# Patient Record
Sex: Female | Born: 1960 | Race: White | Hispanic: No | Marital: Married | Smoking: Never smoker
Health system: Southern US, Community
[De-identification: ages and names within clinical notes are randomized; demographics above are authoritative.]

## PROBLEM LIST (undated history)

## (undated) DIAGNOSIS — F419 Anxiety disorder, unspecified: Secondary | ICD-10-CM

## (undated) DIAGNOSIS — F32A Depression, unspecified: Secondary | ICD-10-CM

## (undated) DIAGNOSIS — M797 Fibromyalgia: Secondary | ICD-10-CM

## (undated) DIAGNOSIS — F329 Major depressive disorder, single episode, unspecified: Secondary | ICD-10-CM

## (undated) DIAGNOSIS — R413 Other amnesia: Secondary | ICD-10-CM

## (undated) HISTORY — PX: BLADDER SURGERY: SHX569

## (undated) HISTORY — DX: Major depressive disorder, single episode, unspecified: F32.9

## (undated) HISTORY — PX: RECTOCELE REPAIR: SHX761

## (undated) HISTORY — DX: Depression, unspecified: F32.A

## (undated) HISTORY — DX: Anxiety disorder, unspecified: F41.9

## (undated) HISTORY — PX: CYSTOCELE REPAIR: SHX163

## (undated) HISTORY — DX: Fibromyalgia: M79.7

## (undated) HISTORY — PX: ABDOMINAL HYSTERECTOMY: SHX81

## (undated) HISTORY — PX: CHOLECYSTECTOMY: SHX55

## (undated) HISTORY — DX: Other amnesia: R41.3

---

## 2010-05-16 HISTORY — PX: VESICOVAGINAL FISTULA CLOSURE W/ TAH: SUR271

## 2014-09-25 DIAGNOSIS — N8111 Cystocele, midline: Secondary | ICD-10-CM | POA: Insufficient documentation

## 2014-11-10 DIAGNOSIS — N816 Rectocele: Secondary | ICD-10-CM | POA: Insufficient documentation

## 2015-02-11 ENCOUNTER — Ambulatory Visit (INDEPENDENT_AMBULATORY_CARE_PROVIDER_SITE_OTHER): Payer: 59 | Admitting: Neurology

## 2015-02-11 ENCOUNTER — Encounter: Payer: Self-pay | Admitting: Neurology

## 2015-02-11 VITALS — BP 144/90 | HR 72 | Resp 16 | Ht 65.5 in | Wt 162.8 lb

## 2015-02-11 DIAGNOSIS — M5442 Lumbago with sciatica, left side: Secondary | ICD-10-CM | POA: Diagnosis not present

## 2015-02-11 DIAGNOSIS — G4700 Insomnia, unspecified: Secondary | ICD-10-CM | POA: Diagnosis not present

## 2015-02-11 DIAGNOSIS — M545 Low back pain, unspecified: Secondary | ICD-10-CM | POA: Insufficient documentation

## 2015-02-11 DIAGNOSIS — F418 Other specified anxiety disorders: Secondary | ICD-10-CM | POA: Diagnosis not present

## 2015-02-11 DIAGNOSIS — M797 Fibromyalgia: Secondary | ICD-10-CM | POA: Diagnosis not present

## 2015-02-11 DIAGNOSIS — M5431 Sciatica, right side: Secondary | ICD-10-CM

## 2015-02-11 DIAGNOSIS — M5432 Sciatica, left side: Secondary | ICD-10-CM | POA: Diagnosis not present

## 2015-02-11 HISTORY — DX: Sciatica, right side: M54.31

## 2015-02-11 MED ORDER — GABAPENTIN 800 MG PO TABS: 800.0000 mg | ORAL_TABLET | Freq: Every day | ORAL | Status: DC

## 2015-02-11 MED ORDER — ETODOLAC 400 MG PO TABS: 400.0000 mg | ORAL_TABLET | Freq: Two times a day (BID) | ORAL | Status: DC

## 2015-02-11 NOTE — Progress Notes (Signed)
[facility]  PATIENT: Kimberly Swanson DOB: 1960-07-22  REFERRING DOCTOR OR PCP:  Gala Romney Schultz/Amy Moon ([place]) SOURCE: patient and records from [facility],  _________________________________   HISTORICAL  CHIEF COMPLAINT:  Chief Complaint  Patient presents with  . Memory Loss    Former pt. of Dr. Bonnita Hollow from [facility], here for eval of memory loss onset 3-4 yrs. ago, and lower back pain radiating into bilat hips.  An example of her memory loss is that she might forget she has cookies in the oven, and they burn.  Sts. has received some relief of back pain with oral steroids.  Also sts. needs r/f of Gabapentin  qhs./fim  . Back Pain    HISTORY OF PRESENT ILLNESS: Kimberly Swanson is a 54 year old woman who I have previously seen at [facility]. She has fibromyalgia, memory disturbances, insomnia, depression and low back pain.  FMS:   She reports pain in the muscles and joints throughout her body. Pain is worse with pressure on the muscles.  Pain is worse if she is tired.   This has been a chronic problem for her. She was taking gabapentin 800 mg by mouth daily at bedtime and that has helped her pain, though incompletely.   She has not been on Cymbalta.  Insomnia:   She has had difficulties with sleep maintenance insomnia much more so than sleep onset insomnia. This has also been a chronic problem for her. When she started gabapentin at bedtime she had a benefit. She does much better with 800 mg by mouth 2 hours before bedtime than a lower dose. A PSG showed some difficulty staying asleep but was otherwise normal.   No OSA.     LBP/Leg pain:   She reports LBP and left buttock pain radiating to the leg.   Pain increases with standing and decreases laying down with hips slightly stretched.   She was placed on a Prednisone taper and started PT.   The steroid helped.    PT has not helped yet.    Flexeril or daytime gabapentin make her too  sleepy.      Mood/cognitive:   She she has noticed some depression and that has improved with Wellbutrin XL 150 mg daily.  She notes some anxiety, less troublesome than the depression.     She notes reduced cognition, mostly poor focus and some reduced short term memory.  She can multitask most of the time but has some distractability and does best when she makes lists.    Montreal Cognitive Assessment  02/11/2015  Visuospatial/ Executive (0/5) 5  Naming (0/3) 3  Attention: Read list of digits (0/2) 2  Attention: Read list of letters (0/1) 1  Attention: Serial 7 subtraction starting at 100 (0/3) 3  Language: Repeat phrase (0/2) 2  Language : Fluency (0/1) 1  Abstraction (0/2) 2  Delayed Recall (0/5) 3  Orientation (0/6) 6  Total 28  Adjusted Score (based on education) 29       REVIEW OF SYSTEMS: Constitutional: No fevers, chills, sweats, or change in appetite Eyes: No visual changes, double vision, eye pain Ear, nose and throat: No hearing loss, ear pain, nasal congestion, sore throat Cardiovascular: No chest pain, palpitations Respiratory: No shortness of breath at rest or with exertion.   No wheezes GastrointestinaI: No nausea, vomiting, diarrhea, abdominal pain, fecal incontinence Genitourinary: No dysuria, urinary retention or frequency.  No nocturia. Musculoskeletal: No neck pain, back pain Integumentary: No rash, pruritus, skin lesions Neurological: as  above Psychiatric: No depression at this time.  No anxiety Endocrine: No palpitations, diaphoresis, change in appetite, change in weigh or increased thirst Hematologic/Lymphatic: No anemia, purpura, petechiae. Allergic/Immunologic: No itchy/runny eyes, nasal congestion, recent allergic reactions, rashes  ALLERGIES: Allergies  Allergen Reactions  . Codeine Nausea And Vomiting    HOME MEDICATIONS:  Current outpatient prescriptions:  .  ALPRAZolam (XANAX) 0.25 MG tablet, 0.25 mg., Disp: , Rfl:  .  buPROPion  (WELLBUTRIN XL) 150 MG 24 hr tablet, 150 mg., Disp: , Rfl:  .  etodolac (LODINE) 400 MG tablet, Take 1 tablet (400 mg total) by mouth 2 (two) times daily., Disp: 180 tablet, Rfl: 1 .  gabapentin (NEURONTIN) 800 MG tablet, Take 1 tablet (800 mg total) by mouth at bedtime., Disp: 90 tablet, Rfl: 4  PAST MEDICAL HISTORY: Past Medical History  Diagnosis Date  . Memory loss   . Depression   . Anxiety   . Fibromyalgia     PAST SURGICAL HISTORY: Past Surgical History  Procedure Laterality Date  . Vesicovaginal fistula closure w/ tah  2012  . Cholecystectomy      FAMILY HISTORY: No family history on file.  SOCIAL HISTORY:  Social History   Social History  . Marital Status: Married    Spouse Name: N/A  . Number of Children: N/A  . Years of Education: N/A   Occupational History  . Not on file.   Social History Main Topics  . Smoking status: Not on file  . Smokeless tobacco: Not on file  . Alcohol Use: Not on file  . Drug Use: Not on file  . Sexual Activity: Not on file   Other Topics Concern  . Not on file   Social History Narrative  . No narrative on file     PHYSICAL EXAM  Filed Vitals:   02/11/15 1035  BP: 144/90  Pulse: 72  Resp: 16  Height: 5' 5.5" (1.664 m)  Weight: 162 lb 12.8 oz (73.846 kg)    Body mass index is 26.67 kg/(m^2).   General: The patient is well-developed and well-nourished and in no acute distress  Eyes:  Funduscopic exam shows normal optic discs and retinal vessels.  Neck: The neck is supple, no carotid bruits are noted.  The neck is nontender.  Cardiovascular: The heart has a regular rate and rhythm with a normal S1 and S2. There were no murmurs, gallops or rubs.   Skin: Extremities are without significant edema.  Musculoskeletal:  Back is tender over the left much more so than right piriformis muscle. Motion is good at the hip. The trochanteric bursae are not tender. The paraspinals are not too tender.  Neurologic  Exam  Mental status: The patient is alert and oriented x 3 at the time of the examination. The patient has apparent normal recent and remote memory, with an apparently normal attention span and concentration ability.   Speech is normal.  Cranial nerves: Extraocular movements are full. Pupils are equal, round, and reactive to light and accomodation.    There is good facial sensation to soft touch bilaterally.Facial strength is normal.  Trapezius and sternocleidomastoid strength is normal. No dysarthria is noted.  The tongue is midline, and the patient has symmetric elevation of the soft palate. No obvious hearing deficits are noted.  Motor:  Muscle bulk is normal.   Tone is normal. Strength is  5 / 5 in all 4 extremities.   Sensory: Sensory testing is intact to pinprick, soft touch and vibration sensation  in all 4 extremities.  Coordination: Cerebellar testing reveals good finger-nose-finger and heel-to-shin bilaterally.  Gait and station: Station is normal.   Gait is normal. Tandem gait is normal. Romberg is negative.   Reflexes: Deep tendon reflexes are symmetric and normal bilaterally.       DIAGNOSTIC DATA (LABS, IMAGING, TESTING) - I reviewed patient records, labs, notes, testing and imaging myself where available.      ASSESSMENT AND PLAN  Sciatica of left side  Fibromyalgia  Insomnia  Left-sided low back pain with left-sided sciatica  Depression with anxiety   1.   Renew gabapentin. 2.   Start an NSAID. She is pain in having surgery soon and should stop the anti-inflammatory a couple days before her surgery. 3.    She will call us back in about a month if not better and we will do an MRI of the lumbar spine and consider also doing a piriformis muscle trigger point injection. 4.    She will return to see me in 2 months or sooner if there are new or worsening neurologic symptoms. 5.   We discussed that her memory issues are most likely due to decreased focus, probably  from depression. She should continue on the Wellbutrin.  45 minutes face to face with >50% of the time counseling and coordinating care about her neurologic issues and prognosis  Richard A. Epimenio Foot, MD, PhD 02/11/2015, 6:29 PM Certified in Neurology, Clinical Neurophysiology, Sleep Medicine, Pain Medicine and Neuroimaging  [facility] [address] [place], [place] [address] [phone]

## 2015-02-25 ENCOUNTER — Telehealth: Payer: Self-pay | Admitting: Neurology

## 2015-02-25 DIAGNOSIS — M5442 Lumbago with sciatica, left side: Secondary | ICD-10-CM

## 2015-02-25 NOTE — Telephone Encounter (Signed)
I have spoken with Kimberly Swanson this afternoon.  She sts. no relief of back pain with Etodolac, physical therapy.  She reqests mri.  Per RAS last ov note, if pt continues to be unsuccessful, he would order mri L-spine.  As RAS is out of the office this week, I have spoken with Dr. Vickey Hugerohmeier.  She has looked at chart and sts. since pt. with no hx. of cancer in which mets might be an issue, and no hx. of back surgery, mri L-spine without contrast should be adequate.  MRI ordered, with [facility] if insurance prefers./fim

## 2015-02-25 NOTE — Telephone Encounter (Signed)
Patient called, Dr. Felecia Shelling had prescribed etodolac (LODINE) 400 MG tablet in the past and "it didn't do anything". Patient was under the impression Dr. Felecia Shelling was going to give her something stronger however when she received Rx in the mail from [facility] it was the same medication, etodolac (Lodine). Patient also wanted to advise that she is still doing PT "hasn't done a thing for me" but is continuing to do it since it's not costing her anything (she has met [facility] and out of pocket). Dr. Felecia Shelling had previously mentioned doing an MRI. She would like to have this done at either [facility] or [facility]. Please call patient at [phone] or [phone].

## 2015-03-02 NOTE — Telephone Encounter (Signed)
Update given to RAS/fim

## 2015-03-05 NOTE — Telephone Encounter (Signed)
Updated the MRI auth [facility]. Called/LMOM for Wedny regarding this change.

## 2015-03-05 NOTE — Telephone Encounter (Signed)
[facility] [phone], order was received for MRI but is [facility]. Toniann FailWendy is requesting change in order to [facility]. Tax ID# [id number] NPI# [id number].

## 2015-03-13 ENCOUNTER — Telehealth: Payer: Self-pay | Admitting: Neurology

## 2015-03-13 NOTE — Telephone Encounter (Signed)
Pt called requesting MRI results. Please call and advise at [phone] and [phone]

## 2015-03-16 NOTE — Telephone Encounter (Signed)
She had wanted [facility]   ---  we don't have the results yet sent to us. Please find out where it was done and we can directly request the report and the films on CD.

## 2015-03-17 NOTE — Telephone Encounter (Signed)
I have spoken with Efraim KaufmannMelissa in MRI at [facility], (phone # [phone]) and have requeted cd of mri spine recently done there/fim

## 2015-03-17 NOTE — Telephone Encounter (Signed)
I have spoken with MRI at [facility] and per their request, faxed a request for Kimberly Swanson's mri spine that was recently done there, even tho Dr. Epimenio FootSater was the ordering physician--to them at [phone]/fim

## 2015-03-23 NOTE — Telephone Encounter (Signed)
CD of mri received today, on RAS desk for review/fim

## 2015-03-24 ENCOUNTER — Telehealth: Payer: Self-pay | Admitting: *Deleted

## 2015-03-24 NOTE — Telephone Encounter (Signed)
-----   Message from Richard A Sater, MD sent at 03/23/2015  5:20 PM EST ----- Please let her know that the MRI of the lumbar spine shows Degenerative changes worse at L4L5 but no nerve root compression.    

## 2015-03-24 NOTE — Telephone Encounter (Signed)
LMTC./fim 

## 2015-03-24 NOTE — Telephone Encounter (Signed)
-----   Message from Asa Lenteichard A Sater, MD sent at 03/23/2015  5:20 PM EST ----- Please let her know that the MRI of the lumbar spine shows Degenerative changes worse at L4L5 but no nerve root compression.

## 2015-03-27 ENCOUNTER — Encounter: Payer: Self-pay | Admitting: Neurology

## 2015-04-14 ENCOUNTER — Ambulatory Visit: Payer: 59 | Admitting: Neurology

## 2015-04-20 ENCOUNTER — Telehealth: Payer: Self-pay | Admitting: *Deleted

## 2015-04-20 NOTE — Telephone Encounter (Signed)
I have spoken with Gayatri and advised that I spoke with her on 03-24-15, and per RAS, advised that mri L-spine showed degen. changes worse at L4-5, bu no nerve root compression.  She verbalized understanding of same/fim

## 2015-04-20 NOTE — Telephone Encounter (Signed)
Patient called, states she never received results from MRI, would like to know that we have received results before she comes in for appointment 04/22/15. Please call [phone].

## 2015-04-20 NOTE — Telephone Encounter (Signed)
Duplicate encounter/fim 

## 2015-04-22 ENCOUNTER — Ambulatory Visit (INDEPENDENT_AMBULATORY_CARE_PROVIDER_SITE_OTHER): Payer: 59 | Admitting: Neurology

## 2015-04-22 ENCOUNTER — Encounter: Payer: Self-pay | Admitting: Neurology

## 2015-04-22 VITALS — BP 138/79 | HR 81 | Ht 65.5 in | Wt 167.6 lb

## 2015-04-22 DIAGNOSIS — F418 Other specified anxiety disorders: Secondary | ICD-10-CM

## 2015-04-22 DIAGNOSIS — G4700 Insomnia, unspecified: Secondary | ICD-10-CM | POA: Diagnosis not present

## 2015-04-22 DIAGNOSIS — M797 Fibromyalgia: Secondary | ICD-10-CM

## 2015-04-22 DIAGNOSIS — M5442 Lumbago with sciatica, left side: Secondary | ICD-10-CM | POA: Diagnosis not present

## 2015-04-22 MED ORDER — CYCLOBENZAPRINE HCL 5 MG PO TABS: 5.0000 mg | ORAL_TABLET | Freq: Every day | ORAL | Status: DC

## 2015-04-22 MED ORDER — ETODOLAC 400 MG PO TABS: 400.0000 mg | ORAL_TABLET | Freq: Two times a day (BID) | ORAL | Status: DC

## 2015-04-22 NOTE — Progress Notes (Signed)
[facility]  PATIENT: Kimberly Swanson DOB: 04/22/1961  REFERRING DOCTOR OR PCP:  Gala Romneyoug Schultz/Amy Moon ([place]) SOURCE: patient and records from [facility],  _________________________________   HISTORICAL  CHIEF COMPLAINT:  Chief Complaint  Patient presents with  . Follow-up    rm 3. Doing okay. Had to go off etodolac for 7 days for surgery. It helps. Was doing PT but stopped d/t surgery. They told her 9-12 weeks for recovery. She is on week 4. She does not think gabapentin is helping very much anymore. She wakes up around 330/4am.     HISTORY OF PRESENT ILLNESS: Kimberly Lady SaucierHosler is a 54 year old woman with fibromyalgia, memory disturbances, insomnia, depression and low back pain.    She had pelvic surgery a few weeks ago and is still sore.     She needed to be off her NSAID a week and feels better with it.     FMS:   She reports pain in the muscles and joints throughout her body. Pain is worse with pressure on the muscles.  Pain is worse if she is tired.   This has been a chronic problem for her. She was taking gabapentin 800 mg by mouth daily at bedtime and that has helped her pain, though incompletely.   She has not been on Cymbalta.  Insomnia:   She has had difficulties with sleep maintenance insomnia much more so than sleep onset insomnia. This has also been a chronic problem for her. When she started gabapentin at bedtime she had a benefit. She does much better with 800 mg by mouth 2 hours before bedtime than a lower dose. A PSG showed some difficulty staying asleep but was otherwise normal.   No OSA.     LBP/Leg pain:   She reports LBP only if she stands a while but will have left buttock pain radiating to the leg more frequently.   Pain increases with standing and decreases laying down with hips slightly stretched.   She was placed on a Prednisone taper and started PT.   The steroid helped.    PT has not helped yet.    Flexeril or daytime gabapentin make  her too sleepy.      MRI lumbar spine 03/05/15 (personally reviewed) showed degenerative disc changes and spondylosis at L3L4 and L4L5 but no nerve root impingement.     Mood/cognitive:   She she has noticed some depression and that has improved with Wellbutrin XL 150 mg daily.  She notes some anxiety, less troublesome than the depression.     She notes reduced cognition, mostly poor focus and some reduced short term memory.  She can multitask most of the time but has some distractability and does best when she makes lists.    Montreal Cognitive Assessment  02/11/2015  Visuospatial/ Executive (0/5) 5  Naming (0/3) 3  Attention: Read list of digits (0/2) 2  Attention: Read list of letters (0/1) 1  Attention: Serial 7 subtraction starting at 100 (0/3) 3  Language: Repeat phrase (0/2) 2  Language : Fluency (0/1) 1  Abstraction (0/2) 2  Delayed Recall (0/5) 3  Orientation (0/6) 6  Total 28  Adjusted Score (based on education) 29      REVIEW OF SYSTEMS: Constitutional: No fevers, chills, sweats, or change in appetite.   Notes fatigue Eyes: No visual changes, double vision, eye pain Ear, nose and throat: No hearing loss, ear pain, nasal congestion, sore throat Cardiovascular: No chest pain, palpitations Respiratory: No shortness of  breath at rest or with exertion.   No wheezes GastrointestinaI: No nausea, vomiting, diarrhea, abdominal pain, fecal incontinence Genitourinary: No dysuria, urinary retention or frequency.  No nocturia. Musculoskeletal: as above  Integumentary: No rash, pruritus, skin lesions Neurological: as above Psychiatric: Notes depression and anxiety Endocrine: No palpitations, diaphoresis, change in appetite, change in weigh or increased thirst Hematologic/Lymphatic: No anemia, purpura, petechiae. Allergic/Immunologic: No itchy/runny eyes, nasal congestion, recent allergic reactions, rashes  ALLERGIES: Allergies  Allergen Reactions  . Codeine Nausea And  Vomiting    HOME MEDICATIONS:  Current outpatient prescriptions:  .  ALPRAZolam (XANAX) 0.25 MG tablet, 0.25 mg., Disp: , Rfl:  .  buPROPion (WELLBUTRIN XL) 150 MG 24 hr tablet, 150 mg., Disp: , Rfl:  .  CLIMARA 0.1 MG/24HR patch, Apply 1 patch topically once a week., Disp: , Rfl:  .  docusate sodium (COLACE) 100 MG capsule, Take 100 mg by mouth 2 (two) times daily., Disp: , Rfl:  .  etodolac (LODINE) 400 MG tablet, Take 1 tablet (400 mg total) by mouth 2 (two) times daily., Disp: 180 tablet, Rfl: 1 .  gabapentin (NEURONTIN) 800 MG tablet, Take 1 tablet (800 mg total) by mouth at bedtime., Disp: 90 tablet, Rfl: 4 .  omeprazole (PRILOSEC) 40 MG capsule, Take 40 mg by mouth daily., Disp: , Rfl:  .  polyethylene glycol (MIRALAX / GLYCOLAX) packet, Take 1 packet by mouth daily., Disp: , Rfl:  .  ranitidine (ZANTAC) 150 MG capsule, Take 150 mg by mouth daily., Disp: , Rfl:   PAST MEDICAL HISTORY: Past Medical History  Diagnosis Date  . Memory loss   . Depression   . Anxiety   . Fibromyalgia     PAST SURGICAL HISTORY: Past Surgical History  Procedure Laterality Date  . Vesicovaginal fistula closure w/ tah  2012  . Cholecystectomy      FAMILY HISTORY: History reviewed. No pertinent family history.  SOCIAL HISTORY:  Social History   Social History  . Marital Status: Married    Spouse Name: N/A  . Number of Children: N/A  . Years of Education: N/A   Occupational History  . Not on file.   Social History Main Topics  . Smoking status: Never Smoker   . Smokeless tobacco: Not on file  . Alcohol Use: Not on file  . Drug Use: Not on file  . Sexual Activity: Not on file   Other Topics Concern  . Not on file   Social History Narrative     PHYSICAL EXAM  Filed Vitals:   04/22/15 1101  BP: 138/79  Pulse: 81  Height: 5' 5.5" (1.664 m)  Weight: 167 lb 9.6 oz (76.023 kg)    Body mass index is 27.46 kg/(m^2).   General: The patient is well-developed and  well-nourished and in no acute distress   Musculoskeletal:  Back is tender over the left > right piriformis muscle. ROM is good at the hip.  Osie Cheeks.. The trochanteric bursae are not tender. The paraspinals are not tender.    Also tender over many of the classic fibromyalgia tender points of the upper chest , upper back and neck.  Neurologic Exam  Mental status: The patient is alert and oriented x 3 at the time of the examination. The patient has apparent normal recent and remote memory, with an apparently normal attention span and concentration ability.   Speech is normal.  Cranial nerves: Extraocular movements are full. Pupils are equal, round, and reactive to light and accomodation.  There is good facial sensation to soft touch bilaterally.Facial strength is normal.  Trapezius and sternocleidomastoid strength is normal. No dysarthria is noted.  The tongue is midline, and the patient has symmetric elevation of the soft palate. No obvious hearing deficits are noted.  Motor:  Muscle bulk is normal.   Tone is normal. Strength is  5 / 5 in all 4 extremities.   Sensory: Sensory testing is intact to pinprick, soft touch and vibration sensation in all 4 extremities.  Coordination: Cerebellar testing reveals good finger-nose-finger bilaterally.  Gait and station: Station is normal.   Gait is normal. Tandem gait is normal.  e.   Reflexes: Deep tendon reflexes are symmetric and normal bilaterally.       DIAGNOSTIC DATA (LABS, IMAGING, TESTING) - I reviewed patient records, labs, notes, testing and imaging myself where available.      ASSESSMENT AND PLAN  Fibromyalgia  Left-sided low back pain with left-sided sciatica  Insomnia  Depression with anxiety   1.   Continue gabapentin but move to 10 pm.   Can also try flexeril 5 mg at bedtime 2.   Continue NSAID.  3.     Set up PT if not better in January.     If pain worsens, we can do a piriformis muscle injection 4.     rtc 6  months or sooner if new or worsening neurologic issues.  Rosette Bellavance A. Epimenio Foot, MD, PhD 04/22/2015, 11:18 AM Certified in Neurology, Clinical Neurophysiology, Sleep Medicine, Pain Medicine and Neuroimaging  [facility] [address] [place], [place] [address] [phone]

## 2015-04-23 ENCOUNTER — Ambulatory Visit: Payer: 59 | Admitting: Neurology

## 2016-04-13 ENCOUNTER — Telehealth: Payer: Self-pay | Admitting: Neurology

## 2016-04-14 NOTE — Telephone Encounter (Signed)
error 

## 2016-05-05 ENCOUNTER — Encounter: Payer: Self-pay | Admitting: Neurology

## 2016-05-05 ENCOUNTER — Ambulatory Visit (INDEPENDENT_AMBULATORY_CARE_PROVIDER_SITE_OTHER): Payer: 59 | Admitting: Neurology

## 2016-05-05 VITALS — BP 114/80 | HR 78 | Resp 16 | Ht 63.5 in | Wt 152.5 lb

## 2016-05-05 DIAGNOSIS — M5442 Lumbago with sciatica, left side: Secondary | ICD-10-CM | POA: Diagnosis not present

## 2016-05-05 DIAGNOSIS — F418 Other specified anxiety disorders: Secondary | ICD-10-CM

## 2016-05-05 DIAGNOSIS — G4700 Insomnia, unspecified: Secondary | ICD-10-CM | POA: Diagnosis not present

## 2016-05-05 DIAGNOSIS — M797 Fibromyalgia: Secondary | ICD-10-CM | POA: Diagnosis not present

## 2016-05-05 MED ORDER — CYCLOBENZAPRINE HCL 5 MG PO TABS: 5.0000 mg | ORAL_TABLET | Freq: Every day | ORAL | 3 refills | Status: DC

## 2016-05-05 MED ORDER — GABAPENTIN 800 MG PO TABS: 800.0000 mg | ORAL_TABLET | Freq: Every day | ORAL | 4 refills | Status: DC

## 2016-05-05 MED ORDER — ETODOLAC 400 MG PO TABS: 400.0000 mg | ORAL_TABLET | Freq: Two times a day (BID) | ORAL | 3 refills | Status: DC

## 2016-05-05 NOTE — Progress Notes (Signed)
[facility]  PATIENT: Kimberly Swanson DOB: 09/05/1960  REFERRING DOCTOR OR PCP:  Gala Romney Schultz/Amy Moon () SOURCE: patient and records from [facility],  _________________________________   HISTORICAL  CHIEF COMPLAINT:  Chief Complaint  Patient presents with  . FMS    Sts. she continues Etodolac prn generalized pain.  Sts. sleep is improved since moving Gabapentin to 10pm/fim  . Insomnia    HISTORY OF PRESENT ILLNESS: Kimberly Swanson is a 55 year old woman with fibromyalgia, memory disturbances, insomnia, depression and low back pain.      FMS:   She has pain in the muscles and joints throughout her body.    This has been a chronic problem for her. She was taking gabapentin 800 mg by mouth daily at bedtime and that has helped her pain, though incompletely.  During the day she takes etodolac. Tylenol with Advil also helps.  Insomnia:   She had a bad night last night but is generally doing better.   She has had difficulties with sleep maintenance insomnia much more so than sleep onset insomnia. This has also been a chronic problem for her. Gabapentin 800 mg by mouth 2 hours before bedtime helps her sleep. A PSG showed some difficulty staying asleep but was otherwise normal.   No OSA.     LBP/Leg pain:   Her LBP has been more intermittent.  Pain sometimes radiates to the left buttock.   If this occurs, she sometimes gets some painful numbness down the left leg.   (occasional right) .   She reports LBP only if she stands a while but will have left buttock pain radiating to the leg more frequently.   Pain increases with standing or sitting a while increases ht pain and walking gently helps to decrease it.     PT had not helped.   She tries to walk as exercise.    Gabapentin at night helps some and she rarely takes a Flexeril (causes lethargy).    MRI lumbar spine 03/05/15 showed degenerative disc changes and spondylosis at L3L4 and L4L5 but no nerve root  impingement.     Mood/cognitive:   Depression improved with Wellbutrin XL 150 mg daily and she tolerates it well.  She notes occasional anxiety, less troublesome than the depression.   She very rarely (2/month) takes a Xanax.      She feels cognition is stable, though sometimes she has poor focus.   MoCA test was 29/30 last year.     REVIEW OF SYSTEMS: Constitutional: No fevers, chills, sweats, or change in appetite.   Notes fatigue Eyes: No visual changes, double vision, eye pain Ear, nose and throat: No hearing loss, ear pain, nasal congestion, sore throat Cardiovascular: No chest pain, palpitations Respiratory: No shortness of breath at rest or with exertion.   No wheezes GastrointestinaI: No nausea, vomiting, diarrhea, abdominal pain, fecal incontinence Genitourinary: No dysuria, urinary retention or frequency.  No nocturia. Musculoskeletal: as above  Integumentary: No rash, pruritus, skin lesions Neurological: as above Psychiatric: Notes depression and anxiety Endocrine: No palpitations, diaphoresis, change in appetite, change in weigh or increased thirst Hematologic/Lymphatic: No anemia, purpura, petechiae. Allergic/Immunologic: No itchy/runny eyes, nasal congestion, recent allergic reactions, rashes  ALLERGIES: Allergies  Allergen Reactions  . Codeine Nausea And Vomiting    HOME MEDICATIONS:  Current Outpatient Prescriptions:  .  ALPRAZolam (XANAX) 0.25 MG tablet, 0.25 mg., Disp: , Rfl:  .  buPROPion (WELLBUTRIN XL) 150 MG 24 hr tablet, 150 mg., Disp: , Rfl:  .  CLIMARA 0.1 MG/24HR patch, Apply 1 patch topically once a week., Disp: , Rfl:  .  cyclobenzaprine (FLEXERIL) 5 MG tablet, Take 1 tablet (5 mg total) by mouth at bedtime., Disp: 90 tablet, Rfl: 3 .  etodolac (LODINE) 400 MG tablet, Take 1 tablet (400 mg total) by mouth 2 (two) times daily., Disp: 180 tablet, Rfl: 3 .  gabapentin (NEURONTIN) 800 MG tablet, Take 1 tablet (800 mg total) by mouth at bedtime., Disp:  90 tablet, Rfl: 4 .  polyethylene glycol (MIRALAX / GLYCOLAX) packet, Take 1 packet by mouth daily., Disp: , Rfl:  .  omeprazole (PRILOSEC) 40 MG capsule, Take 40 mg by mouth daily., Disp: , Rfl:  .  ranitidine (ZANTAC) 150 MG capsule, Take 150 mg by mouth daily., Disp: , Rfl:   PAST MEDICAL HISTORY: Past Medical History:  Diagnosis Date  . Anxiety   . Depression   . Fibromyalgia   . Memory loss     PAST SURGICAL HISTORY: Past Surgical History:  Procedure Laterality Date  . CHOLECYSTECTOMY    . VESICOVAGINAL FISTULA CLOSURE W/ TAH  2012    FAMILY HISTORY: No family history on file.  SOCIAL HISTORY:  Social History   Social History  . Marital status: Married    Spouse name: N/A  . Number of children: N/A  . Years of education: N/A   Occupational History  . Not on file.   Social History Main Topics  . Smoking status: Never Smoker  . Smokeless tobacco: Not on file  . Alcohol use Not on file  . Drug use: Unknown  . Sexual activity: Not on file   Other Topics Concern  . Not on file   Social History Narrative  . No narrative on file     PHYSICAL EXAM  Vitals:   05/05/16 0946  BP: 114/80  Pulse: 78  Resp: 16  Weight: 152 lb 8 oz (69.2 kg)  Height: 5' 3.5" (1.613 m)    Body mass index is 26.59 kg/m.   General: The patient is well-developed and well-nourished and in no acute distress   Musculoskeletal:  Back is tender over the left > right piriformis muscle.   The trochanteric bursae are not tender. The paraspinals are not tender.    Also tender over many of the classic fibromyalgia tender points of the upper chest , upper back and neck.  Neurologic Exam  Mental status: The patient is alert and oriented x 3 at the time of the examination. The patient has apparent normal recent and remote memory, with an apparently normal attention span and concentration ability.   Speech is normal.  Cranial nerves: Extraocular movements are full. Pupils are equal,  round, and reactive to light and accomodation.    There is good facial sensation to soft touch bilaterally.Facial strength is normal.  Trapezius and sternocleidomastoid strength is normal. No dysarthria is noted.  The tongue is midline, and the patient has symmetric elevation of the soft palate. No obvious hearing deficits are noted.  Motor:  Muscle bulk is normal.   Tone is normal. Strength is  5 / 5 in all 4 extremities.   Sensory: Sensory testing is intact to touch and vibration sensation in all 4 extremities.  Coordination: Cerebellar testing reveals good finger-nose-finger bilaterally.  Gait and station: Station is normal.   Gait is normal. Tandem gait is mildly wide  Reflexes: Deep tendon reflexes are symmetric and normal bilaterally.        DIAGNOSTIC DATA (LABS, IMAGING,  TESTING) - I reviewed patient records, labs, notes, testing and imaging myself where available.      ASSESSMENT AND PLAN  Fibromyalgia  Insomnia, unspecified type  Left-sided low back pain with left-sided sciatica, unspecified chronicity  Depression with anxiety   1.   Continue gabapentin qHS and prn flexeril 5 mg at bedtime 2.   Continue NSAID 1 - 2 times daily.  3.   Piriformis muscle exercises.   Continue to be active / walk.  4.   rtc 12 months or sooner if new or worsening neurologic issues.  Leelyn Jasinski A. Epimenio FootSater, MD, PhD 05/05/2016, 10:11 AM Certified in Neurology, Clinical Neurophysiology, Sleep Medicine, Pain Medicine and Neuroimaging  [facility] [address], [address] [place], [place] [address] [phone]

## 2016-05-11 DIAGNOSIS — N951 Menopausal and female climacteric states: Secondary | ICD-10-CM

## 2016-05-11 HISTORY — DX: Menopausal and female climacteric states: N95.1

## 2017-02-16 ENCOUNTER — Ambulatory Visit (INDEPENDENT_AMBULATORY_CARE_PROVIDER_SITE_OTHER): Payer: 59 | Admitting: Neurology

## 2017-02-16 ENCOUNTER — Encounter (INDEPENDENT_AMBULATORY_CARE_PROVIDER_SITE_OTHER): Payer: Self-pay

## 2017-02-16 ENCOUNTER — Encounter: Payer: Self-pay | Admitting: Neurology

## 2017-02-16 VITALS — BP 110/70 | HR 68 | Resp 16 | Ht 63.5 in | Wt 152.0 lb

## 2017-02-16 DIAGNOSIS — M5431 Sciatica, right side: Secondary | ICD-10-CM | POA: Diagnosis not present

## 2017-02-16 DIAGNOSIS — F418 Other specified anxiety disorders: Secondary | ICD-10-CM | POA: Diagnosis not present

## 2017-02-16 DIAGNOSIS — G4700 Insomnia, unspecified: Secondary | ICD-10-CM | POA: Diagnosis not present

## 2017-02-16 DIAGNOSIS — M5442 Lumbago with sciatica, left side: Secondary | ICD-10-CM

## 2017-02-16 DIAGNOSIS — M5432 Sciatica, left side: Secondary | ICD-10-CM | POA: Diagnosis not present

## 2017-02-16 DIAGNOSIS — M797 Fibromyalgia: Secondary | ICD-10-CM | POA: Diagnosis not present

## 2017-02-16 MED ORDER — GABAPENTIN 800 MG PO TABS: 800.0000 mg | ORAL_TABLET | Freq: Every day | ORAL | 4 refills | Status: DC

## 2017-02-16 MED ORDER — ETODOLAC 400 MG PO TABS: 400.0000 mg | ORAL_TABLET | Freq: Two times a day (BID) | ORAL | 3 refills | Status: DC

## 2017-02-16 NOTE — Progress Notes (Signed)
[facility]  PATIENT: Kimberly Swanson DOB: 02-Aug-1960  REFERRING DOCTOR OR PCP:  Gala Romney Schultz/Amy Moon () SOURCE: patient and records from [facility],  _________________________________   HISTORICAL  CHIEF COMPLAINT:  Chief Complaint  Patient presents with  . Fibromyalgia    Sts. bilat buttock pain radiating down back of legs to knees is worse. Sts. no relief  with PT or piriformis exercises.Kimberly Swanson  . Back Pain    HISTORY OF PRESENT ILLNESS: Kimberly Swanson is a 56 year old woman with fibromyalgia, memory disturbances, insomnia, depression and low back pain.      Update 02/16/2017:   She reports bilateral buttock pain that radiates to the back of the legs and just below the knees.   Pain worsened in January 2018 and progressively worsened.    The buttocks are painful, left > right.   Pain is like a scouring pad.  It radiates and intensiffies when she sits.  She went to PT and had no benefit.    In Swanson-July, she was more active and stretching more and pain improved.   Pain worsened in August again and became more symmetric.     She denies weakness in her legs.  The right leg is more painful but the left leg is more numb.   She has no change in bladder function.      She notes no change in balance or gait.      The FMS pain is better on gabapentin.    She occasionally takes Flexeril at bedtime.  It helps but has a bad hangover (even 2.5 mg) the next morning.   Insomnia is much better with gabapentin.    Mood is doing well on Wellbutrin and she no longer feels depressed.  MRI lumbar spine 03/05/15 showed degenerative disc changes and spondylosis at L3L4 and L4L5 but no nerve root impingement.     ___________________________________________ From 05/05/2016 FMS:   She has pain in the muscles and joints throughout her body.    This has been a chronic problem for her. She was taking gabapentin 800 mg by mouth daily at bedtime and that has helped her pain,  though incompletely.  During the day she takes etodolac. Tylenol with Advil also helps.  Insomnia:   She had a bad night last night but is generally doing better.   She has had difficulties with sleep maintenance insomnia much more so than sleep onset insomnia. This has also been a chronic problem for her. Gabapentin 800 mg by mouth 2 hours before bedtime helps her sleep. A PSG showed some difficulty staying asleep but was otherwise normal.   No OSA.     LBP/Leg pain:   Her LBP has been more intermittent.  Pain sometimes radiates to the left buttock.   If this occurs, she sometimes gets some painful numbness down the left leg.   (occasional right) .   She reports LBP only if she stands a while but will have left buttock pain radiating to the leg more frequently.   Pain increases with standing or sitting a while increases ht pain and walking gently helps to decrease it.     PT had not helped.   She tries to walk as exercise.    Gabapentin at night helps some and she rarely takes a Flexeril (causes lethargy).    MRI lumbar spine 03/05/15 showed degenerative disc changes and spondylosis at L3L4 and L4L5 but no nerve root impingement.     Mood/cognitive:   Depression improved  with Wellbutrin XL 150 mg daily and she tolerates it well.  She notes occasional anxiety, less troublesome than the depression.   She very rarely (2/month) takes a Xanax.      She feels cognition is stable, though sometimes she has poor focus.   MoCA test was 29/30 last year.     REVIEW OF SYSTEMS: Constitutional: No fevers, chills, sweats, or change in appetite.   Notes fatigue Eyes: No visual changes, double vision, eye pain Ear, nose and throat: No hearing loss, ear pain, nasal congestion, sore throat Cardiovascular: No chest pain, palpitations Respiratory: No shortness of breath at rest or with exertion.   No wheezes GastrointestinaI: No nausea, vomiting, diarrhea, abdominal pain, fecal incontinence Genitourinary: No dysuria,  urinary retention or frequency.  No nocturia. Musculoskeletal: as above  Integumentary: No rash, pruritus, skin lesions Neurological: as above Psychiatric: Notes depression and anxiety Endocrine: No palpitations, diaphoresis, change in appetite, change in weigh or increased thirst Hematologic/Lymphatic: No anemia, purpura, petechiae. Allergic/Immunologic: No itchy/runny eyes, nasal congestion, recent allergic reactions, rashes  ALLERGIES: Allergies  Allergen Reactions  . Codeine Nausea And Vomiting    HOME MEDICATIONS:  Current Outpatient Prescriptions:  .  acetaminophen (TYLENOL) 325 MG tablet, Take 650 mg by mouth every 6 (six) hours as needed., Disp: , Rfl:  .  ALPRAZolam (XANAX) 0.25 MG tablet, 0.25 mg., Disp: , Rfl:  .  aspirin EC 81 MG tablet, Take 81 mg by mouth daily., Disp: , Rfl:  .  buPROPion (WELLBUTRIN XL) 150 MG 24 hr tablet, 150 mg., Disp: , Rfl:  .  CLIMARA 0.1 MG/24HR patch, Apply 1 patch topically once a week., Disp: , Rfl:  .  cyclobenzaprine (FLEXERIL) 5 MG tablet, Take 1 tablet (5 mg total) by mouth at bedtime., Disp: 90 tablet, Rfl: 3 .  etodolac (LODINE) 400 MG tablet, Take 1 tablet (400 mg total) by mouth 2 (two) times daily., Disp: 180 tablet, Rfl: 3 .  gabapentin (NEURONTIN) 800 MG tablet, Take 1 tablet (800 mg total) by mouth at bedtime., Disp: 90 tablet, Rfl: 4 .  ibuprofen (ADVIL,MOTRIN) 200 MG tablet, Take 200 mg by mouth every 6 (six) hours as needed., Disp: , Rfl:  .  omeprazole (PRILOSEC) 40 MG capsule, Take 40 mg by mouth daily., Disp: , Rfl:  .  polyethylene glycol (MIRALAX / GLYCOLAX) packet, Take 1 packet by mouth daily., Disp: , Rfl:   PAST MEDICAL HISTORY: Past Medical History:  Diagnosis Date  . Anxiety   . Depression   . Fibromyalgia   . Memory loss     PAST SURGICAL HISTORY: Past Surgical History:  Procedure Laterality Date  . CHOLECYSTECTOMY    . VESICOVAGINAL FISTULA CLOSURE W/ TAH  2012    FAMILY HISTORY: No family history  on file.  SOCIAL HISTORY:  Social History   Social History  . Marital status: Married    Spouse name: N/A  . Number of children: N/A  . Years of education: N/A   Occupational History  . Not on file.   Social History Main Topics  . Smoking status: Never Smoker  . Smokeless tobacco: Never Used  . Alcohol use Not on file  . Drug use: Unknown  . Sexual activity: Not on file   Other Topics Concern  . Not on file   Social History Narrative  . No narrative on file     PHYSICAL EXAM  Vitals:   02/16/17 1407  BP: 110/70  Pulse: 68  Resp: 16  Weight:  152 lb (68.9 kg)  Height: 5' 3.5" (1.613 m)    Body mass index is 26.5 kg/m.   General: The patient is well-developed and well-nourished and in no acute distress   Musculoskeletal:  She is tender over the piriformis muscles, left worse than right.   No  trochanteric bursae tenderness   She also has tenderness over the classic FMS tender points of the neck/chest      Neurologic Exam  Mental status: The patient is alert and oriented x 3 at the time of the examination. The patient has apparent normal recent and remote memory, with an apparently normal attention span and concentration ability.   Speech is normal.  Cranial nerves: Extraocular movements are full. Pupils are equal, round, and reactive to light and accomodation.    There is good facial sensation to soft touch bilaterally.Facial strength is normal.  Trapezius and sternocleidomastoid strength is normal. No dysarthria is noted.  The tongue is midline, and the patient has symmetric elevation of the soft palate. No obvious hearing deficits are noted.  Motor:  Muscle bulk is normal.   Tone is normal. Strength is  5 / 5 in all 4 extremities.   Sensory: Sensory testing is intact to touch and vibration sensation in all 4 extremities.   Gait and station: Station is normal.   Gait is normal. Tandem gait is mildly wide  Reflexes: Deep tendon reflexes are symmetric and  normal bilaterally.        DIAGNOSTIC DATA (LABS, IMAGING, TESTING) - I reviewed patient records, labs, notes, testing and imaging myself where available.      ASSESSMENT AND PLAN  Bilateral sciatica  Fibromyalgia  Insomnia, unspecified type  Left-sided low back pain with left-sided sciatica, unspecified chronicity  Depression with anxiety   1.   Refill gabapentin qHS and prn flexeril 25 mg at bedtime 2.   Continue etodolac once or twice a day 3.   Piriformis muscle trigger point injections with a total of 80 mg Depomedrol in 5 cc Marcaine split between the two muscles.   Pain was better afterwards.      4.  Continue to do stretching exercises and be active / walk.  4.   rtc 12 months or sooner if new or worsening neurologic issues.  Kimberly Ruhe A. Epimenio Foot, MD, PhD 02/16/2017, 2:34 PM Certified in Neurology, Clinical Neurophysiology, Sleep Medicine, Pain Medicine and Neuroimaging  [facility] [address] [place], [place] [address] [phone]

## 2017-05-22 ENCOUNTER — Ambulatory Visit: Payer: 59 | Admitting: Neurology

## 2018-01-09 ENCOUNTER — Encounter: Payer: Self-pay | Admitting: Neurology

## 2018-02-19 ENCOUNTER — Ambulatory Visit: Payer: 59 | Admitting: Neurology

## 2018-03-14 DIAGNOSIS — R1013 Epigastric pain: Secondary | ICD-10-CM

## 2018-03-14 DIAGNOSIS — Z860100 Personal history of colon polyps, unspecified: Secondary | ICD-10-CM

## 2018-03-14 DIAGNOSIS — K581 Irritable bowel syndrome with constipation: Secondary | ICD-10-CM

## 2018-03-14 DIAGNOSIS — K219 Gastro-esophageal reflux disease without esophagitis: Secondary | ICD-10-CM

## 2018-03-14 HISTORY — DX: Personal history of colonic polyps: Z86.010

## 2018-03-14 HISTORY — DX: Irritable bowel syndrome with constipation: K58.1

## 2018-03-14 HISTORY — DX: Gastro-esophageal reflux disease without esophagitis: K21.9

## 2018-03-14 HISTORY — DX: Personal history of colon polyps, unspecified: Z86.0100

## 2018-03-14 HISTORY — DX: Epigastric pain: R10.13

## 2018-03-19 ENCOUNTER — Telehealth: Payer: Self-pay | Admitting: Neurology

## 2018-03-19 NOTE — Telephone Encounter (Signed)
I called pt back. Scheduled appt for her to see Shanda Bumps, NP at 915am on 03/21/18. She has enough medication. Has about a month left.  She states husband needs appt with Dr. Epimenio Foot. Offered date/times but she will need to speak with him first. She will call back. Offered tomorrow at 130pm with Dr. Epimenio Foot.

## 2018-03-19 NOTE — Telephone Encounter (Signed)
Pt requesting refills for gabapentin (NEURONTIN) 800 MG tablet [facility]

## 2018-03-21 ENCOUNTER — Ambulatory Visit: Payer: Self-pay | Admitting: Adult Health

## 2018-03-26 ENCOUNTER — Encounter: Payer: Self-pay | Admitting: Adult Health

## 2018-03-26 ENCOUNTER — Ambulatory Visit: Payer: 59 | Admitting: Adult Health

## 2018-03-26 VITALS — BP 135/90 | HR 87 | Wt 154.0 lb

## 2018-03-26 DIAGNOSIS — M5442 Lumbago with sciatica, left side: Secondary | ICD-10-CM

## 2018-03-26 DIAGNOSIS — M5432 Sciatica, left side: Secondary | ICD-10-CM

## 2018-03-26 DIAGNOSIS — M5431 Sciatica, right side: Secondary | ICD-10-CM | POA: Diagnosis not present

## 2018-03-26 DIAGNOSIS — M797 Fibromyalgia: Secondary | ICD-10-CM | POA: Diagnosis not present

## 2018-03-26 MED ORDER — GABAPENTIN 800 MG PO TABS: 800.0000 mg | ORAL_TABLET | Freq: Every day | ORAL | 3 refills | Status: DC

## 2018-03-26 NOTE — Progress Notes (Signed)
[facility]  PATIENT: Kimberly Swanson DOB: 1960/05/24  REFERRING DOCTOR OR PCP:  Gala Romney Schultz/Amy Moon ([place]) SOURCE: patient and records from [facility],  _________________________________   HISTORICAL  CHIEF COMPLAINT:  Chief Complaint  Patient presents with  . Follow-up    Follow up for Fibromyalgia room 9 pt alone pt irriated because she was told three different stories about her medications  pt  was last here in 02/2017     HISTORY OF PRESENT ILLNESS: Kimberly Swanson is a 57 year old woman with fibromyalgia, memory disturbances, insomnia, depression and low back pain.      Interval history 03/26/2018: Patient is being seen today for medication management.  She states in August 2019, she had a fall which resulted in 3 ruptured thoracic discs.  She was started on prednisone which helped to relieve pain some.  In September 2019, she states she ended up having a pinched nerve due to her ruptured discs where she was unable to lay flat and had to sleep sitting up.  She underwent an injection but does feel as though she started to obtain relief prior to the injection.  She is scheduled for repeat injection on 04/20/2018 but unsure as though she will need this as her pain has been tolerable.  She has recently stopped taking Tylenol, Flexeril, etodolac and ibuprofen as she was having GI issues and was felt to be due to multiple medications.  She has continued on gabapentin 800 mg nightly which continues to provide her relief due to sciatica.  She does state slightly worsened due to recent injury but has been slowly improving especially when she increases her activity.  No further concerns at this time.  __________________________________________ From 02/16/2017 RS:   She reports bilateral buttock pain that radiates to the back of the legs and just below the knees.   Pain worsened in January 2018 and progressively worsened.    The buttocks are painful, left > right.    Pain is like a scouring pad.  It radiates and intensiffies when she sits.  She went to PT and had no benefit.    In May-July, she was more active and stretching more and pain improved.   Pain worsened in August again and became more symmetric.     She denies weakness in her legs.  The right leg is more painful but the left leg is more numb.   She has no change in bladder function.      She notes no change in balance or gait.      The FMS pain is better on gabapentin.    She occasionally takes Flexeril at bedtime.  It helps but has a bad hangover (even 2.5 mg) the next morning.   Insomnia is much better with gabapentin.    Mood is doing well on Wellbutrin and she no longer feels depressed.  MRI lumbar spine 03/05/15 showed degenerative disc changes and spondylosis at L3L4 and L4L5 but no nerve root impingement.     ___________________________________________ From 05/05/2016 RS FMS:   She has pain in the muscles and joints throughout her body.    This has been a chronic problem for her. She was taking gabapentin 800 mg by mouth daily at bedtime and that has helped her pain, though incompletely.  During the day she takes etodolac. Tylenol with Advil also helps.  Insomnia:   She had a bad night last night but is generally doing better.   She has had difficulties with sleep maintenance  insomnia much more so than sleep onset insomnia. This has also been a chronic problem for her. Gabapentin 800 mg by mouth 2 hours before bedtime helps her sleep. A PSG showed some difficulty staying asleep but was otherwise normal.   No OSA.     LBP/Leg pain:   Her LBP has been more intermittent.  Pain sometimes radiates to the left buttock.   If this occurs, she sometimes gets some painful numbness down the left leg.   (occasional right) .   She reports LBP only if she stands a while but will have left buttock pain radiating to the leg more frequently.   Pain increases with standing or sitting a while increases ht pain and  walking gently helps to decrease it.     PT had not helped.   She tries to walk as exercise.    Gabapentin at night helps some and she rarely takes a Flexeril (causes lethargy).    MRI lumbar spine 03/05/15 showed degenerative disc changes and spondylosis at L3L4 and L4L5 but no nerve root impingement.     Mood/cognitive:   Depression improved with Wellbutrin XL 150 mg daily and she tolerates it well.  She notes occasional anxiety, less troublesome than the depression.   She very rarely (2/month) takes a Xanax.      She feels cognition is stable, though sometimes she has poor focus.   MoCA test was 29/30 last year.     REVIEW OF SYSTEMS: Constitutional: No fevers, chills, sweats, or change in appetite.   Notes fatigue Eyes: No visual changes, double vision, eye pain Ear, nose and throat: No hearing loss, ear pain, nasal congestion, sore throat Cardiovascular: No chest pain, palpitations Respiratory: No shortness of breath at rest or with exertion.   No wheezes GastrointestinaI: No nausea, vomiting, diarrhea, abdominal pain, fecal incontinence Genitourinary: No dysuria, urinary retention or frequency.  No nocturia. Musculoskeletal:Joint pain Integumentary: No rash, pruritus, skin lesions Neurological: as above Psychiatric: Notes depression and anxiety Endocrine: No palpitations, diaphoresis, change in appetite, change in weigh or increased thirst Hematologic/Lymphatic: No anemia, purpura, petechiae. Allergic/Immunologic: No itchy/runny eyes, nasal congestion, recent allergic reactions, rashes  ALLERGIES: Allergies  Allergen Reactions  . Codeine Nausea And Vomiting    HOME MEDICATIONS:  Current Outpatient Medications:  .  ALPRAZolam (XANAX) 0.25 MG tablet, 0.25 mg., Disp: , Rfl:  .  aspirin EC 81 MG tablet, Take 81 mg by mouth daily., Disp: , Rfl:  .  buPROPion (WELLBUTRIN XL) 150 MG 24 hr tablet, 150 mg., Disp: , Rfl:  .  gabapentin (NEURONTIN) 800 MG tablet, Take 1 tablet (800  mg total) by mouth at bedtime., Disp: 90 tablet, Rfl: 3 .  omeprazole (PRILOSEC) 40 MG capsule, Take 40 mg by mouth daily., Disp: , Rfl:  .  polyethylene glycol (MIRALAX / GLYCOLAX) packet, Take 1 packet by mouth daily., Disp: , Rfl:   PAST MEDICAL HISTORY: Past Medical History:  Diagnosis Date  . Anxiety   . Depression   . Fibromyalgia   . Memory loss     PAST SURGICAL HISTORY: Past Surgical History:  Procedure Laterality Date  . CHOLECYSTECTOMY    . VESICOVAGINAL FISTULA CLOSURE W/ TAH  2012    FAMILY HISTORY: History reviewed. No pertinent family history.  SOCIAL HISTORY:  Social History   Socioeconomic History  . Marital status: Married    Spouse name: Not on file  . Number of children: Not on file  . Years of education: Not on file  .  Highest education level: Not on file  Occupational History  . Not on file  Social Needs  . Financial resource strain: Not on file  . Food insecurity:    Worry: Not on file    Inability: Not on file  . Transportation needs:    Medical: Not on file    Non-medical: Not on file  Tobacco Use  . Smoking status: Never Smoker  . Smokeless tobacco: Never Used  Substance and Sexual Activity  . Alcohol use: Not on file  . Drug use: Not on file  . Sexual activity: Not on file  Lifestyle  . Physical activity:    Days per week: Not on file    Minutes per session: Not on file  . Stress: Not on file  Relationships  . Social connections:    Talks on phone: Not on file    Gets together: Not on file    Attends religious service: Not on file    Active member of club or organization: Not on file    Attends meetings of clubs or organizations: Not on file    Relationship status: Not on file  . Intimate partner violence:    Fear of current or ex partner: Not on file    Emotionally abused: Not on file    Physically abused: Not on file    Forced sexual activity: Not on file  Other Topics Concern  . Not on file  Social History Narrative    . Not on file     PHYSICAL EXAM  Vitals:   03/26/18 0925  BP: 135/90  Pulse: 87  Weight: 154 lb (69.9 kg)    Body mass index is 26.85 kg/m.   General: The patient is well-developed and well-nourished and in no acute distress   Musculoskeletal:  She is tender over the piriformis muscles, left worse than right.   No  trochanteric bursae tenderness   She also has tenderness over the classic FMS tender points of the neck/chest      Neurologic Exam  Mental status: The patient is alert and oriented x 3 at the time of the examination. The patient has apparent normal recent and remote memory, with an apparently normal attention span and concentration ability.   Speech is normal.  Cranial nerves: Extraocular movements are full. Pupils are equal, round, and reactive to light and accomodation.    There is good facial sensation to soft touch bilaterally.Facial strength is normal.  Trapezius and sternocleidomastoid strength is normal. No dysarthria is noted.  The tongue is midline, and the patient has symmetric elevation of the soft palate. No obvious hearing deficits are noted.  Motor:  Muscle bulk is normal.   Tone is normal.  Equal full strength in all tested extremities  Sensory: Sensory testing is intact to touch and vibration sensation in all 4 extremities.   Gait and station: Station is normal.   Gait is normal. Tandem gait is mildly wide  Reflexes: Deep tendon reflexes are symmetric and normal bilaterally.        DIAGNOSTIC DATA (LABS, IMAGING, TESTING) - I reviewed patient records, labs, notes, testing and imaging myself where available.  No recent imaging    ASSESSMENT AND PLAN  Kimberly Swanson is a 57 year old female with fibromyalgia, memory disturbances, insomnia, depression and low back pain.  Recent injury in 12/2017 after fall resulting in ruptured thoracic discs.   1.   Refill gabapentin 800 mg nightly 2.   Patient requesting to continue to hold off  on restarting  Flexeril and etodolac until her stomach completely heals.  Advised patient to call office when she is ready to restart and prescription will be sent in 3.   Advised patient to continue to stay active and maintain a healthy diet 4.   Advised patient to return in 1 year or call earlier if needed with possible need of trigger point injections.  Greater than 50% of time during this 25 minute visit was spent on counseling,explanation of diagnosis of fibromyalgia, low back pain and sciatica, planning of further management along with continuation of current treatment plan, discussion with patient and family and coordination of care   George Hugh, [employer]  [facility] [address] [place], [place] [address]  Phone [phone] Fax [phone] Note: This document was prepared with digital dictation and possible smart phrase technology. Any transcriptional errors that result from this process are unintentional.

## 2018-03-26 NOTE — Patient Instructions (Addendum)
Your Plan:  Continue gabapentin 800mg  daily  Once you feel as though stomach is healed and feeling better, let us know if you want to restart flexeril and etodolac and we will send in order at that time   Follow up in 1 year or call earlier if needed     Thank you for coming to see [place] at [facility]. I hope we have been able to provide you high quality care today.  You may receive a patient satisfaction survey over the next few weeks. We would appreciate your feedback and comments so that we may continue to improve ourselves and the health of our patients.

## 2018-03-31 NOTE — Progress Notes (Signed)
I have read the note, and I agree with the clinical assessment and plan.  Levoy Geisen A. Damaso Laday, MD, PhD, FAAN Certified in Neurology, Clinical Neurophysiology, Sleep Medicine, Pain Medicine and Neuroimaging  [facility] [address], [address] [place], [place] [address] [phone]  

## 2018-05-15 ENCOUNTER — Other Ambulatory Visit: Payer: Self-pay | Admitting: Neurology

## 2018-06-21 ENCOUNTER — Ambulatory Visit: Payer: 59 | Admitting: Neurology

## 2018-06-21 ENCOUNTER — Encounter

## 2018-07-16 DIAGNOSIS — R079 Chest pain, unspecified: Secondary | ICD-10-CM

## 2018-07-16 DIAGNOSIS — E785 Hyperlipidemia, unspecified: Secondary | ICD-10-CM

## 2018-08-28 DIAGNOSIS — E785 Hyperlipidemia, unspecified: Secondary | ICD-10-CM | POA: Insufficient documentation

## 2018-08-28 DIAGNOSIS — R079 Chest pain, unspecified: Secondary | ICD-10-CM | POA: Insufficient documentation

## 2018-08-28 NOTE — Progress Notes (Signed)
Virtual Visit via Video Note   This visit type was conducted due to national recommendations for restrictions regarding the COVID-19 Pandemic (e.g. social distancing) in an effort to limit this patient's exposure and mitigate transmission in our community.  Due to her co-morbid illnesses, this patient is at least at moderate risk for complications without adequate follow up.  This format is felt to be most appropriate for this patient at this time.  All issues noted in this document were discussed and addressed.  A limited physical exam was performed with this format.  Please refer to the patient's chart for her consent to telehealth for [facility].   Evaluation Performed:  Follow-up visit  Date:  08/29/2018   ID:  Kimberly Swanson M Kimberly Swanson, DOB 05/18/1960, MRN [id number]  Patient Location: Home  Provider Location: Office  PCP:  Hurshel PartyMoon, Amy A, NP  Cardiologist:  No primary care provider on file. Dr Dulce SellarMunley Electrophysiologist:  None   Chief Complaint:  Chest pain new patient referral from Gaye AlkenAmy Moon NP  History of Present Illness:    Kimberly Ulanda EdisonM Swanson is a 58 y.o. female with recent [facility] admission for chest pain.  The patient does not have symptoms concerning for COVID-19 infection (fever, chills, cough, or new shortness of breath).   She was [facility]16/2018 with nonexertional chest pain.  EKG was independently reviewed by me I received copies of the from the hospital the first EKG was performed at 23:25 07/15/2018 the second at 07:35 hours 07/16/2018.  Both EKG shows sinus rhythm there is artifact present on 1 at most nonspecific ST abnormality and for all purposes both were normal EKGs.  Her CBC was normal renal function was normal serial cardiac enzymes were assessed found to be normal there is no arrhythmia noted during the hospitalization and prior to discharge she had a pharmacologic Myoview study showed an ejection fraction of 77% and normal myocardial perfusion without  ischemia or infarction.  Her chest x-ray showed linear scarring at the left lung base felt to show no active lung disease.  She was discharged from the hospital her medications included aspirin statin and gabapentin.  Visit started off video I was able to visualize of her physical examination we lost audio and switched to telephone.  She tells me have seen her in the past likely 5 years ago and she had normal stress test.  She has a blood pressure elevated does not check frequently but does not have a diagnosis of hypertension.  Recently she decided to start lipid-lowering therapy with a minimum dose of pravastatin with significant dyslipidemia.  I reviewed records her LDL was 149 cholesterol 265 LDL of 50.  She tells me that she often is has chest pain generally is very mild very vague and she just tries to ignore it it is nonexertional not relieved with rest.  She also notices that it is point localized and said that when she is in the emergency room her chest wall is tender to palpation.  The episode that brought her to the hospital was clearly different it was pressure is diffuse through the chest rated up into her jaw on the left shoulder unfortunately was never given sublingual nitroglycerin and by the time she was given nitroglycerin paste it seemed to be dissipating.  She has not had another recurrent episode of that.  She does not have known congenital rheumatic heart disease she also has vaguely short of breath when she does activities but attributes this to  her chronic back pain and being sedentary.  No edema orthopnea palpitations syncope or TIA.  Past Medical History:  Diagnosis Date   Anxiety    Depression    Fibromyalgia    Memory loss    Past Surgical History:  Procedure Laterality Date   ABDOMINAL HYSTERECTOMY     BLADDER SURGERY     CHOLECYSTECTOMY     CYSTOCELE REPAIR     RECTOCELE REPAIR     VESICOVAGINAL FISTULA CLOSURE W/ [facility]  2012     Current Meds  Medication  Sig   acetaminophen (TYLENOL 8 HOUR ARTHRITIS PAIN) 650 MG CR tablet Take 650 mg by mouth 2 (two) times daily.   acetaminophen (TYLENOL) 500 MG tablet Take 500 mg by mouth 2 (two) times daily.   ALPRAZolam (XANAX) 0.25 MG tablet Take 0.25 mg by mouth daily as needed.    Aluminum & Magnesium Hydroxide (MAGNESIUM-ALUMINUM PO) Take 330 mg by mouth daily.   aspirin EC 81 MG tablet Take 81 mg by mouth daily.   b complex vitamins tablet Take 1 tablet by mouth daily.   buPROPion (WELLBUTRIN XL) 150 MG 24 hr tablet Take 150 mg by mouth daily.    Calcium Carb-Cholecalciferol (CALTRATE 600+D3 PO) Take 1 tablet by mouth 2 (two) times daily.   Coenzyme Q10 (CO Q 10 PO) Take 1 tablet by mouth daily.   COLLAGEN PO Take 1 tablet by mouth daily.   estradiol (CLIMARA - DOSED IN MG/24 HR) 0.1 mg/24hr patch Place 1 patch onto the skin once a week.   etodolac (LODINE) 400 MG tablet TAKE 1 TABLET TWICE A DAY   gabapentin (NEURONTIN) 800 MG tablet Take 1 tablet (800 mg total) by mouth at bedtime.   Omeprazole 20 MG TBEC Take 20 mg by mouth daily.    polyethylene glycol (MIRALAX / GLYCOLAX) packet Take 1 packet by mouth daily.   pravastatin (PRAVACHOL) 10 MG tablet TAKE 1 TABLET BY MOUTH EVERYDAY AT BEDTIME   Probiotic Product (PROBIOTIC DAILY PO) Take 1 tablet by mouth daily.   vitamin B-12 (CYANOCOBALAMIN) 100 MCG tablet Take 100 mcg by mouth daily.     Allergies:   Codeine   Social History   Tobacco Use   Smoking status: Never Smoker   Smokeless tobacco: Never Used  Substance Use Topics   Alcohol use: Never    Alcohol/week: 0.0 standard drinks    Frequency: Never   Drug use: Never     Family Hx: The patient's family history includes Heart attack in her maternal grandmother; Irregular heart beat in her mother; Lung cancer in her father.  ROS:  Review of Systems  Constitution: Negative.  Eyes: Negative.   Cardiovascular: Positive for chest pain and dyspnea on exertion.    Respiratory: Positive for shortness of breath.   Endocrine: Negative.   Hematologic/Lymphatic: Negative.   Skin: Negative.   Musculoskeletal: Positive for back pain and myalgias.  Gastrointestinal: Negative.   Genitourinary: Negative.   Neurological: Negative.   Psychiatric/Behavioral: Negative.   Allergic/Immunologic: Negative.     All other systems reviewed and are negative.   Prior CV studies:   The following studies were reviewed today:    Labs/Other Tests and Data Reviewed:    EKG:    Recent Labs: No results found for requested labs within last 8760 hours.   Recent Lipid Panel No results found for: CHOL, TRIG, HDL, CHOLHDL, LDLCALC, LDLDIRECT  Wt Readings from Last 3 Encounters:  08/29/18 153 lb (69.4 kg)  03/26/18  154 lb (69.9 kg)  02/16/17 152 lb (68.9 kg)     Objective:    Vital Signs:  BP (!) 168/85 (BP Location: Left Arm, Patient Position: Sitting)    Pulse 68    Ht 5\' 5"  (1.651 m)    Wt 153 lb (69.4 kg)    BMI 25.46 kg/m    Constitutional, well-nourished well-developed in no acute distress Vital signs reviewed Eyes, conjunctiva and sclera are normal without pallor or icterus extraocular motions intact and normal there is no lid lag Respiratory, normal effort and excursion no audible wheezing without a stethoscope Cardiovascular, no neck vein distention or peripheral edema Skin, no rash skin lesion or ulceration of the extremities Neurologic, cranial nerves II to XII are grossly intact and the patient moves all 4 extremities Neuro/Psychiatric, judgment and thought processes are intact and coherent, alert and oriented x3, mood and affect appear normal.  ASSESSMENT & PLAN:    1. Chest pain, atypical but concerning and certainly is at high risk with age untreated hypertension hyperlipidemia.  I convinced her to remain on her statin and start low-dose of a beta-blocker acebutolol 200 mg daily.  We both feel she should have further evaluation and I will  tentatively schedule her for cardiac CTA in June hoping that will be through the COVID-19 search in [place] and back to doing elective outpatient testing.  She will remain on aspirin and with normal troponin myocardial perfusion study I do not think she really needs referral for emergent coronary angiography.  I asked her if the symptoms worsen or become more typical to contact me. 2. I suspect she does indeed have hypertension start a low-dose beta-blocker and I encouraged her to check her blood pressure twice daily and record 3. Stable hyperlipidemia I suspect this is the highest dose of a statin will get her today, she has diagnosed CAD and I would continue the minimum dose of a low intensity statin for class effect  COVID-19 Education: The signs and symptoms of COVID-19 were discussed with the patient and how to seek care for testing (follow up with PCP or arrange E-visit).  The importance of social distancing was discussed today.  Time:   Today, I have spent 45 minutes with the patient with telehealth technology discussing the above problems.     Medication Adjustments/Labs and Tests Ordered: Current medicines are reviewed at length with the patient today.  Concerns regarding medicines are outlined above.   Tests Ordered: No orders of the defined types were placed in this encounter.   Medication Changes: No orders of the defined types were placed in this encounter.   Disposition:  Follow up June 2020  SignedNorman Herrlich, MD  08/29/2018 9:55 AM    [facility]

## 2018-08-29 ENCOUNTER — Other Ambulatory Visit: Payer: Self-pay

## 2018-08-29 ENCOUNTER — Encounter: Payer: Self-pay | Admitting: Cardiology

## 2018-08-29 ENCOUNTER — Telehealth: Payer: Self-pay | Admitting: Cardiology

## 2018-08-29 ENCOUNTER — Telehealth (INDEPENDENT_AMBULATORY_CARE_PROVIDER_SITE_OTHER): Payer: POS | Admitting: Cardiology

## 2018-08-29 VITALS — BP 168/85 | HR 68 | Ht 65.0 in | Wt 153.0 lb

## 2018-08-29 DIAGNOSIS — E782 Mixed hyperlipidemia: Secondary | ICD-10-CM

## 2018-08-29 DIAGNOSIS — R079 Chest pain, unspecified: Secondary | ICD-10-CM | POA: Diagnosis not present

## 2018-08-29 DIAGNOSIS — R030 Elevated blood-pressure reading, without diagnosis of hypertension: Secondary | ICD-10-CM

## 2018-08-29 MED ORDER — ACEBUTOLOL HCL 200 MG PO CAPS: 200.0000 mg | ORAL_CAPSULE | Freq: Two times a day (BID) | ORAL | 2 refills | Status: DC

## 2018-08-29 NOTE — Telephone Encounter (Signed)
YOUR CARDIOLOGY TEAM HAS ARRANGED FOR AN E-VISIT FOR YOUR APPOINTMENT - PLEASE REVIEW IMPORTANT INFORMATION BELOW SEVERAL DAYS PRIOR TO YOUR APPOINTMENT ° °Due to the recent COVID-19 pandemic, we are transitioning in-person office visits to tele-medicine visits in an effort to decrease unnecessary exposure to our patients, their families, and staff. Medicare and most insurances are covering these visits without a copay needed. We also encourage you to sign up for MyChart if you have not already done so. You will need a smartphone if possible. For patients that do not have this, we can still complete the visit using a regular telephone but do prefer a smartphone to enable video when possible. You may have a family member that lives with you that can help. If possible, we also ask that you have a blood pressure cuff and scale at home to measure your blood pressure, heart rate and weight prior to your scheduled appointment. Patients with clinical needs that need an in-person evaluation and testing will still be able to come to the office if absolutely necessary. If you have any questions, feel free to call our office. ° °CONSENT FOR TELE-HEALTH VISIT - PLEASE REVIEW ° °I hereby voluntarily request, consent and authorize [facility] and its employed or contracted physicians, physician assistants, nurse practitioners or other licensed health care professionals (the Practitioner), to provide me with telemedicine health care services (the “Services") as deemed necessary by the treating Practitioner. I acknowledge and consent to receive the Services by the Practitioner via telemedicine. I understand that the telemedicine visit will involve communicating with the Practitioner through live audiovisual communication technology and the disclosure of certain medical information by electronic transmission. I acknowledge that I have been given the opportunity to request an in-person assessment or other available alternative  prior to the telemedicine visit and am voluntarily participating in the telemedicine visit. ° °I understand that I have the right to withhold or withdraw my consent to the use of telemedicine in the course of my care at any time, without affecting my right to future care or treatment, and that the Practitioner or I may terminate the telemedicine visit at any time. I understand that I have the right to inspect all information obtained and/or recorded in the course of the telemedicine visit and may receive copies of available information for a reasonable fee.  I understand that some of the potential risks of receiving the Services via telemedicine include:  °• Delay or interruption in medical evaluation due to technological equipment failure or disruption; °• Information transmitted may not be sufficient (e.g. poor resolution of images) to allow for appropriate medical decision making by the Practitioner; and/or  °• In rare instances, security protocols could fail, causing a breach of personal health information. ° °Furthermore, I acknowledge that it is my responsibility to provide information about my medical history, conditions and care that is complete and accurate to the best of my ability. I acknowledge that Practitioner's advice, recommendations, and/or decision may be based on factors not within their control, such as incomplete or inaccurate data provided by me or distortions of diagnostic images or specimens that may result from electronic transmissions. I understand that the practice of medicine is not an exact science and that Practitioner makes no warranties or guarantees regarding treatment outcomes. I acknowledge that I will receive a copy of this consent concurrently upon execution via email to the email address I last provided but may also request a printed copy by calling the office of [facility].   ° °  I understand that my insurance will be billed for this visit.  ° °I have read or had this  consent read to me. °• I understand the contents of this consent, which adequately explains the benefits and risks of the Services being provided via telemedicine.  °• I have been provided ample opportunity to ask questions regarding this consent and the Services and have had my questions answered to my satisfaction. °• I give my informed consent for the services to be provided through the use of telemedicine in my medical care ° °By participating in this telemedicine visit I agree to the above. ° °Patient gives verbal consent for televisit 08/29/2018 pp °

## 2018-08-29 NOTE — Patient Instructions (Addendum)
Medication Instructions:  Your physician has recommended you make the following change in your medication:  START: Acebutolol 200mg  (1 Tab)  CONTINUE:  Pravastatin 10mg  daily  If you need a refill on your cardiac medications before your next appointment, please call your pharmacy.   Lab work: None  If you have labs (blood work) drawn today and your tests are completely normal, you will receive your results only by: Marland Kitchen. MyChart Message (if you have MyChart) OR . A paper copy in the mail If you have any lab test that is abnormal or we need to change your treatment, we will call you to review the results.  Testing/Proceduresa: Your physician has requested that you have cardiac CT. Cardiac computed tomography (CT) is a painless test that uses an x-ray machine to take clear, detailed pictures of your heart. For further information please visit [email or web address]. Please follow instruction sheet as given. Please arrive at the [facility] at xx:xx AM (30-45 minutes prior to test start time)  [facility] [address] [place], [place] [id number] [phone]  Proceed to the [facility] Radiology Department (First Floor).  Please follow these instructions carefully (unless otherwise directed):  Hold all erectile dysfunction medications at least 48 hours prior to test.  On the Night Before the Test: . Be sure to Drink plenty of water. . Do not consume any caffeinated/decaffeinated beverages or chocolate 12 hours prior to your test. . Do not take any antihistamines 12 hours prior to your test. .   On the Day of the Test: . Drink plenty of water. Do not drink any water within one hour of the test. . Do not eat any food 4 hours prior to the test. . You may take your regular medications prior to the test.    *For Clinical Staff only. Please instruct patient the following:*        -Drink plenty of water       -Hold  Furosemide/hydrochlorothiazide morning of the test                      After the Test: . Drink plenty of water. . After receiving IV contrast, you may experience a mild flushed feeling. This is normal. . On occasion, you may experience a mild rash up to 24 hours after the test. This is not dangerous. If this occurs, you can take Benadryl 25 mg and increase your fluid intake. . If you experience trouble breathing, this can be serious. If it is severe call 911 IMMEDIATELY. If it is mild, please call our office. . If you take any of these medications: Glipizide/Metformin, Avandament, Glucavance, please do not take 48 hours after completing test.   Follow-Up: [facility], you and your health needs are our priority.  As part of our continuing mission to provide you with exceptional heart care, we have created designated Provider Care Teams.  These Care Teams include your primary Cardiologist (physician) and Advanced Practice Providers (APPs -  Physician Assistants and Nurse Practitioners) who all work together to provide you with the care you need, when you need it. .   Any Other Special Instructions Will Be Listed Below (If Applicable)  Blood Pressure Record Sheet To take your blood pressure, you will need a blood pressure machine. You can buy a blood pressure machine (blood pressure monitor) at your clinic, drug store, or online. When choosing one, consider:  An automatic monitor that has  an arm cuff.  A cuff that wraps snugly around your upper arm. You should be able to fit only one finger between your arm and the cuff.  A device that stores blood pressure reading results.  Do not choose a monitor that measures your blood pressure from your wrist or finger. Follow your health care provider's instructions for how to take your blood pressure. To use this form:  Get one reading in the morning (a.m.) before you take any medicines.  Get one reading in the evening (p.m.) before  supper.  Take at least 2 readings with each blood pressure check. This makes sure the results are correct. Wait 1-2 minutes between measurements.  Write down the results in the spaces on this form.  Repeat this once a week, or as told by your health care provider.  Make a follow-up appointment with your health care provider to discuss the results. Blood pressure log Date: _______________________  a.m. _____________________(1st reading) _____________________(2nd reading)  p.m. _____________________(1st reading) _____________________(2nd reading) Date: _______________________  a.m. _____________________(1st reading) _____________________(2nd reading)  p.m. _____________________(1st reading) _____________________(2nd reading) Date: _______________________  a.m. _____________________(1st reading) _____________________(2nd reading)  p.m. _____________________(1st reading) _____________________(2nd reading) Date: _______________________  a.m. _____________________(1st reading) _____________________(2nd reading)  p.m. _____________________(1st reading) _____________________(2nd reading) Date: _______________________  a.m. _____________________(1st reading) _____________________(2nd reading)  p.m. _____________________(1st reading) _____________________(2nd reading) This information is not intended to replace advice given to you by your health care provider. Make sure you discuss any questions you have with your health care provider. Document Released: 01/29/2003 Document Revised: 05/02/2017 Document Reviewed: 05/02/2017 Elsevier Interactive Patient Education  2019 Elsevier Inc.  Acebutolol capsules What is this medicine? ACEBUTOLOL (a se BYOO toe lole) is a beta-blocker. Beta-blockers reduce the workload on the heart and help it to beat more regularly. This medicine is used to treat high blood pressure and to treat or prevent certain heart rhythm problems. This medicine may be  used for other purposes; ask your health care provider or pharmacist if you have questions. COMMON BRAND NAME(S): Sectral What should I tell my health care provider before I take this medicine? They need to know if you have any of these conditions: -diabetes -heart or vessel disease like slow heartrate, worsening heart failure, heart block, sick sinus syndrome or Raynaud's disease -kidney disease -liver disease -lung or breathing disease, like asthma or emphysema -pheochromocytoma -thyroid disease -an unusual or allergic reaction to acebutolol, other beta-blockers, medicines, foods, dyes, or preservatives -pregnant or trying to get pregnant -breast-feeding How should I use this medicine? Take this medicine by mouth with a glass of water. Follow the directions on the prescription label. You can take this medicine with or without food. Take your doses at regular intervals. Do not take your medicine more often than directed. Do not stop taking this medicine suddenly. This could lead to serious heart-related effects. Talk to your pediatrician regarding the use of this medicine in children. Special care may be needed. Overdosage: If you think you have taken too much of this medicine contact a poison control center or emergency room at once. NOTE: This medicine is only for you. Do not share this medicine with others. What if I miss a dose? If you miss a dose, take it as soon as you can. If it is almost time for your next dose, take only that dose. Do not take double or extra doses. What may interact with this medicine? This medicine may interact with the following medications: -certain medicines for blood pressure, heart disease, irregular  heart beat -NSAIDS, medicines for pain and inflammation, like ibuprofen or naproxen This list may not describe all possible interactions. Give your health care provider a list of all the medicines, herbs, non-prescription drugs, or dietary supplements you use.  Also tell them if you smoke, drink alcohol, or use illegal drugs. Some items may interact with your medicine. What should I watch for while using this medicine? Visit your doctor or health care professional for regular checks on your progress. Check your heart rate and blood pressure regularly while you are taking this medicine. Ask your doctor or health care professional what your heart rate and blood pressure should be, and when you should contact him or her. You may get drowsy or dizzy. Do not drive, use machinery, or do anything that needs mental alertness until you know how this drug affects you. Do not stand or sit up quickly, especially if you are an older patient. This reduces the risk of dizzy or fainting spells. Alcohol can make you more drowsy and dizzy. Avoid alcoholic drinks. This medicine can affect blood sugar levels. If you have diabetes, check with your doctor or health care professional before you change your diet or the dose of your diabetic medicine. Do not treat yourself for coughs, colds, or pain while you are taking this medicine without asking your doctor or health care professional for advice. Some ingredients may increase your blood pressure. What side effects may I notice from receiving this medicine? Side effects that you should report to your doctor or health care professional as soon as possible: -allergic reactions like skin rash, itching or hives, swelling of the face, lips, or tongue -breathing problems -chest pain -cold, tingling, or numb hands or feet -confusion -irregular heartbeat -muscle aches and pains -slow heart rate -sweating -swollen legs or ankles -tremor, shakes -vomiting Side effects that usually do not require medical attention (report to your doctor or health care professional if they continue or are bothersome): -anxiety -change in sex drive or performance -depression -diarrhea -dry or burning eyes -headache -nausea This list may not  describe all possible side effects. Call your doctor for medical advice about side effects. You may report side effects to FDA at 1-800-FDA-1088. Where should I keep my medicine? Keep out of the reach of children. Store at room temperature between 15 and 30 degrees C (59 and 86 degrees F). Protect from light. Keep container tightly closed. Throw away any unused medicine after the expiration date. NOTE: This sheet is a summary. It may not cover all possible information. If you have questions about this medicine, talk to your doctor, pharmacist, or health care provider.  2019 Elsevier/Gold Standard (2013-01-06 14:20:28)

## 2018-08-30 NOTE — Addendum Note (Signed)
Addended by: Pamala Hurry on: 08/30/2018 03:33 PM   Modules accepted: Orders

## 2018-09-27 ENCOUNTER — Other Ambulatory Visit: Payer: Self-pay

## 2018-09-27 ENCOUNTER — Telehealth: Payer: Self-pay

## 2018-09-27 DIAGNOSIS — Z01812 Encounter for preprocedural laboratory examination: Secondary | ICD-10-CM

## 2018-09-27 NOTE — Telephone Encounter (Signed)
Patient scheduled for Ct on 11/06/18. Labs ordered, called patient to advise. Went over procedure requirements, meds and lab draws. Letter generated and mailed to patient for her records.

## 2018-10-31 ENCOUNTER — Other Ambulatory Visit: Payer: Self-pay | Admitting: Emergency Medicine

## 2018-10-31 DIAGNOSIS — Z01812 Encounter for preprocedural laboratory examination: Secondary | ICD-10-CM

## 2018-10-31 LAB — CBC
Hematocrit: 37.7 % (ref 34.0–46.6)
Hemoglobin: 12.8 g/dL (ref 11.1–15.9)
MCH: 32 pg (ref 26.6–33.0)
MCHC: 34 g/dL (ref 31.5–35.7)
MCV: 94 fL (ref 79–97)
Platelets: 230 10*3/uL (ref 150–450)
RBC: 4 x10E6/uL (ref 3.77–5.28)
RDW: 11.8 % (ref 11.7–15.4)
WBC: 6 10*3/uL (ref 3.4–10.8)

## 2018-10-31 LAB — BASIC METABOLIC PANEL
BUN/Creatinine Ratio: 26 (ref 12–28)
BUN: 17 mg/dL (ref 8–27)
CO2: 24 mmol/L (ref 20–29)
Calcium: 10.7 mg/dL — ABNORMAL HIGH (ref 8.7–10.3)
Chloride: 102 mmol/L (ref 96–106)
Creatinine, Ser: 0.65 mg/dL (ref 0.57–1.00)
GFR calc Af Amer: 112 mL/min/{1.73_m2} (ref >59)
GFR calc non Af Amer: 97 mL/min/{1.73_m2} (ref >59)
Glucose: 112 mg/dL — ABNORMAL HIGH (ref 65–99)
Potassium: 4.6 mmol/L (ref 3.5–5.2)
Sodium: 141 mmol/L (ref 134–144)

## 2018-11-05 ENCOUNTER — Telehealth (HOSPITAL_COMMUNITY): Payer: Self-pay | Admitting: Emergency Medicine

## 2018-11-05 NOTE — Telephone Encounter (Signed)
Reaching out to patient to offer assistance regarding upcoming cardiac imaging study; pt verbalizes understanding of appt date/time, parking situation and where to check in, pre-test NPO status and medications ordered, and verified current allergies; name and call back number provided for further questions should they arise Lonnie Reth RN Navigator Cardiac Imaging [facility] [phone] office [phone] cell  Pt denies covid symptoms, verbalized understanding of visitor policy. 

## 2018-11-06 ENCOUNTER — Ambulatory Visit (HOSPITAL_COMMUNITY): Payer: POS

## 2018-11-06 ENCOUNTER — Other Ambulatory Visit: Payer: Self-pay

## 2018-11-06 ENCOUNTER — Encounter (HOSPITAL_COMMUNITY): Payer: Self-pay

## 2018-11-06 ENCOUNTER — Ambulatory Visit (HOSPITAL_COMMUNITY)
Admission: RE | Admit: 2018-11-06 | Discharge: 2018-11-06 | Disposition: A | Discharge status: Home / Self Care | Payer: POS | Source: Ambulatory Visit | Attending: Cardiology | Admitting: Cardiology

## 2018-11-06 DIAGNOSIS — R079 Chest pain, unspecified: Secondary | ICD-10-CM | POA: Insufficient documentation

## 2018-11-06 MED ORDER — NITROGLYCERIN 0.4 MG SL SUBL
0.8000 mg | SUBLINGUAL_TABLET | Freq: Once | SUBLINGUAL | Status: AC
  Administered 2018-11-06: 0.8 mg via SUBLINGUAL

## 2018-11-06 MED ORDER — IOHEXOL 350 MG/ML SOLN
100.0000 mL | Freq: Once | INTRAVENOUS | Status: AC | PRN
  Administered 2018-11-06: 100 mL via INTRAVENOUS

## 2018-11-06 MED ORDER — NITROGLYCERIN 0.4 MG SL SUBL
SUBLINGUAL_TABLET | SUBLINGUAL | Status: AC
  Administered 2018-11-06: 0.8 mg via SUBLINGUAL
  Filled 2018-11-06: qty 2

## 2018-11-06 NOTE — Progress Notes (Signed)
Patient tolerated CT without incident. Drank coffee and ambulated to exit steady gait.  

## 2018-11-07 ENCOUNTER — Telehealth: Payer: Self-pay | Admitting: *Deleted

## 2018-11-07 NOTE — Telephone Encounter (Signed)
-----   Message from Richardo Priest, MD sent at 11/07/2018  7:53 AM EDT ----- Normal or stable result  Her CT is abnormal and shows mild blockage or stenosis and one-vessel and this is not uncommon in individuals with chest pain and normal stress test.  This study has been sent out which flow measured no probably take a week to return and I would like to see her in the office the week after July 4 if she is not scheduled face-to-face visit

## 2018-11-07 NOTE — Telephone Encounter (Signed)
Telephone call to patient. Informed of preliminary results and need for an office visit per Dr. Bettina Gavia. Appointment made for 11/15/2018 at 3:15.

## 2018-11-08 ENCOUNTER — Other Ambulatory Visit: Payer: Self-pay | Admitting: Adult Health

## 2018-11-14 NOTE — Progress Notes (Signed)
Cardiology Office Note:    Date:  11/15/2018   ID:  Kimberly Swanson, DOB 03/08/1961, MRN [id number]  PCP:  Hurshel PartyMoon, Amy A, NP  Cardiologist:  Norman HerrlichBrian Munley, MD    Referring MD: Hurshel PartyMoon, Amy A, NP    ASSESSMENT:    1. Costochondritis   2. Coronary artery calcification seen on CAT scan   3. Essential hypertension   4. Mixed hyperlipidemia   5. Mild CAD    PLAN:    In order of problems listed above:  1. Her clinical problem is reproducible chest wall pain chronic consistent with costochondritis I have asked her to consider physical therapy and she will [place] [facility] and I gave her prescription for for electrophoresis of the hypodensity steroid to her chest wall.  She will continue with Tylenol and nonsteroidal anti-inflammatory drugs which she takes for disc disease. 2. She has mild CAD intermediate calcium score continue aspirin statin and I would not restart a beta-blocker with malaise and relatively low blood pressure.  Her chest pain is not anginal 3. Stable I do not think she requires a beta-blocker discontinued 4. Continue her statin she will have labs done in the next few weeks with her PCP 5. Follow-up with me in 1 year regarding her mild nonobstructive CAD continue aspirin and statin   Next appointment: 1 Yr   Medication Adjustments/Labs and Tests Ordered: Current medicines are reviewed at length with the patient today.  Concerns regarding medicines are outlined above.  Orders Placed This Encounter  Procedures  . EKG 12-Lead   No orders of the defined types were placed in this encounter.   Chief Complaint  Patient presents with  . Follow-up    after a cardiac CTA  . Hyperlipidemia    History of Present Illness:    Kimberly Swanson is a 58 y.o. female with a hx of chest pain last seen 08/29/2018 after admission to [facility] detailed below. At that office visit she was started on acebutolol and asked to undergo cardiac CTA.   She was  ad[facility] 07/16/2018 with nonexertional chest pain.  EKG was independently reviewed by me the first was performed at 23:25 07/15/2018 the second at 07:35 hours 07/16/2018.  Both EKG shows sinus rhythm there is artifact present on 1 at most nonspecific ST abnormality and for all purposes both were normal EKGs.  Her CBC was normal renal function was normal serial cardiac enzymes were found to be normal there. No arrhythmia noted during the hospitalization. She had a pharmacologic Myoview study showed an ejection fraction of 77% and normal myocardial perfusion without ischemia or infarction.  Her CXR showed linear scarring at the left lung base felt to show no active lung disease.  She was discharged from the hospital her medications included aspirin statin and gabapentin.   Compliance with diet, lifestyle and medications: Yes  She taken a beta-blocker and just is intolerant with malaise fatigue and has relatively low heart rate and blood pressure at home I asked her to stop.  He is to have nonexertional localized reproducible chest wall pain unrelated to activity and relieved with rest and persist despite nonsteroidal anti-inflammatory drug after discussion of referral for physical therapy modalities.  No edema shortness of breath angina Past Medical History:  Diagnosis Date  . Anxiety   . Depression   . Fibromyalgia   . Memory loss     Past Surgical History:  Procedure Laterality Date  . ABDOMINAL HYSTERECTOMY    .  BLADDER SURGERY    . CHOLECYSTECTOMY    . CYSTOCELE REPAIR    . RECTOCELE REPAIR    . VESICOVAGINAL FISTULA CLOSURE W/ TAH  2012    Current Medications: Current Meds  Medication Sig  . acetaminophen (TYLENOL 8 HOUR ARTHRITIS PAIN) 650 MG CR tablet Take 650 mg by mouth 2 (two) times daily.  Marland Kitchen. ALPRAZolam (XANAX) 0.25 MG tablet Take 0.25 mg by mouth daily as needed.   Marland Kitchen. Aluminum & Magnesium Hydroxide (MAGNESIUM-ALUMINUM PO) Take 330 mg by mouth daily.  Marland Kitchen.  aspirin EC 81 MG tablet Take 81 mg by mouth daily.  Marland Kitchen. b complex vitamins tablet Take 1 tablet by mouth daily.  Marland Kitchen. buPROPion (WELLBUTRIN XL) 150 MG 24 hr tablet Take 150 mg by mouth daily.   . Calcium Carb-Cholecalciferol (CALTRATE 600+D3 PO) Take 1 tablet by mouth 2 (two) times daily.  . Coenzyme Q10 (CO Q 10 PO) Take 1 tablet by mouth daily.  . COLLAGEN PO Take 1 tablet by mouth daily.  Marland Kitchen. estradiol (CLIMARA - DOSED IN MG/24 HR) 0.1 mg/24hr patch Place 1 patch onto the skin once a week.  . etodolac (LODINE) 400 MG tablet TAKE 1 TABLET TWICE A DAY  . gabapentin (NEURONTIN) 800 MG tablet Take 1 tablet (800 mg total) by mouth at bedtime.  . Omeprazole 20 MG TBEC Take 20 mg by mouth daily.   . polyethylene glycol (MIRALAX / GLYCOLAX) packet Take 1 packet by mouth daily.  . pravastatin (PRAVACHOL) 10 MG tablet TAKE 1 TABLET BY MOUTH EVERYDAY AT BEDTIME  . Probiotic Product (PROBIOTIC DAILY PO) Take 1 tablet by mouth daily.  . vitamin B-12 (CYANOCOBALAMIN) 100 MCG tablet Take 100 mcg by mouth daily.  . [DISCONTINUED] acebutolol (SECTRAL) 200 MG capsule Take 1 capsule (200 mg total) by mouth 2 (two) times daily.     Allergies:   Codeine   Social History   Socioeconomic History  . Marital status: Married    Spouse name: Not on file  . Number of children: Not on file  . Years of education: Not on file  . Highest education level: Not on file  Occupational History  . Not on file  Social Needs  . Financial resource strain: Not on file  . Food insecurity    Worry: Not on file    Inability: Not on file  . Transportation needs    Medical: Not on file    Non-medical: Not on file  Tobacco Use  . Smoking status: Never Smoker  . Smokeless tobacco: Never Used  Substance and Sexual Activity  . Alcohol use: Never    Alcohol/week: 0.0 standard drinks    Frequency: Never  . Drug use: Never  . Sexual activity: Not on file  Lifestyle  . Physical activity    Days per week: Not on file    Minutes  per session: Not on file  . Stress: Not on file  Relationships  . Social Musicianconnections    Talks on phone: Not on file    Gets together: Not on file    Attends religious service: Not on file    Active member of club or organization: Not on file    Attends meetings of clubs or organizations: Not on file    Relationship status: Not on file  Other Topics Concern  . Not on file  Social History Narrative  . Not on file     Family History: The patient's family history includes Heart attack in her maternal  grandmother; Irregular heart beat in her mother; Lung cancer in her father. ROS:   Please see the history of present illness.    All other systems reviewed and are negative.  EKGs/Labs/Other Studies Reviewed:    The following studies were reviewed today:  EKG:  EKG ordered today and personally reviewed.  The ekg ordered today demonstrates [facility] normal EKG  Cardiac CTA: IMPRESSION: 1. Coronary calcium score of 59. This was 10 percentile for age and sex matched control. 2. Normal coronary origin with right dominance. 3. Mild calcified plaque in the proximal segment with stenosis 25-49%. Additional analysis with CT FFR will be submitted.  Recent Labs: 10/31/2018: BUN 17; Creatinine, Ser 0.65; Hemoglobin 12.8; Platelets 230; Potassium 4.6; Sodium 141  Recent Lipid Panel Most recent lipid panel 06/07/18 via [facility]. Cholesterol 265 HDL 50 LDL 149 Triglycerides 330  Physical Exam:    VS:  BP 126/84 (BP Location: Right Arm, Patient Position: Sitting, Cuff Size: Normal)   Pulse 66   Temp (!) 96.4 F (35.8 C)   Ht 5\' 5"  (1.651 m)   Wt 153 lb (69.4 kg)   SpO2 98%   BMI 25.46 kg/m     Wt Readings from Last 3 Encounters:  11/15/18 153 lb (69.4 kg)  08/29/18 153 lb (69.4 kg)  03/26/18 154 lb (69.9 kg)     GEN:  Well nourished, well developed in no acute distress HEENT: Normal NECK: No JVD; No carotid bruits LYMPHATICS: No lymphadenopathy CARDIAC: tender L CCJ RRR, no murmurs,  rubs, gallops RESPIRATORY:  Clear to auscultation without rales, wheezing or rhonchi  ABDOMEN: Soft, non-tender, non-distended MUSCULOSKELETAL:  No edema; No deformity  SKIN: Warm and dry NEUROLOGIC:  Alert and oriented x 3 PSYCHIATRIC:  Normal affect    Signed, Shirlee More, MD  11/15/2018 3:57 PM    [facility]

## 2018-11-15 ENCOUNTER — Other Ambulatory Visit: Payer: Self-pay

## 2018-11-15 ENCOUNTER — Ambulatory Visit: Payer: POS | Admitting: Cardiology

## 2018-11-15 ENCOUNTER — Encounter: Payer: Self-pay | Admitting: Cardiology

## 2018-11-15 VITALS — BP 126/84 | HR 66 | Temp 96.4°F | Ht 65.0 in | Wt 153.0 lb

## 2018-11-15 DIAGNOSIS — E782 Mixed hyperlipidemia: Secondary | ICD-10-CM | POA: Diagnosis not present

## 2018-11-15 DIAGNOSIS — I10 Essential (primary) hypertension: Secondary | ICD-10-CM | POA: Diagnosis not present

## 2018-11-15 DIAGNOSIS — I2510 Atherosclerotic heart disease of native coronary artery without angina pectoris: Secondary | ICD-10-CM | POA: Insufficient documentation

## 2018-11-15 DIAGNOSIS — M940 Chondrocostal junction syndrome [Tietze]: Secondary | ICD-10-CM

## 2018-11-15 HISTORY — DX: Chondrocostal junction syndrome (tietze): M94.0

## 2018-11-15 NOTE — Patient Instructions (Addendum)
Medication Instructions:  Your physician has recommended you make the following change in your medication:   STOP: Acebutolol  If you need a refill on your cardiac medications before your next appointment, please call your pharmacy.   Lab work: NOne If you have labs (blood work) drawn today and your tests are completely normal, you will receive your results only by: Marland Kitchen MyChart Message (if you have MyChart) OR . A paper copy in the mail If you have any lab test that is abnormal or we need to change your treatment, we will call you to review the results.  Testing/Procedures: None  Follow-Up: At [facility], you and your health needs are our priority.  As part of our continuing mission to provide you with exceptional heart care, we have created designated Provider Care Teams.  These Care Teams include your primary Cardiologist (physician) and Advanced Practice Providers (APPs -  Physician Assistants and Nurse Practitioners) who all work together to provide you with the care you need, when you need it. You will need a follow up appointment in 1 years.  Any Other Special Instructions Will Be Listed Below (If Applicable).   You are being referred to physical therapy in [place]. A prescription was given to you to give to Physical Therapy office of choice   Costochondritis Costochondritis is swelling and irritation (inflammation) of the tissue (cartilage) that connects your ribs to your breastbone (sternum). This causes pain in the front of your chest. Usually, the pain:  Starts gradually.  Is in more than one rib. This condition usually goes away on its own over time. Follow these instructions at home:  Do not do anything that makes your pain worse.  If directed, put ice on the painful area: ? Put ice in a plastic bag. ? Place a towel between your skin and the bag. ? Leave the ice on for 20 minutes, 2-3 times a day.  If directed, put heat on the affected area as often as told by  your doctor. Use the heat source that your doctor tells you to use, such as a moist heat pack or a heating pad. ? Place a towel between your skin and the heat source. ? Leave the heat on for 20-30 minutes. ? Take off the heat if your skin turns bright red. This is very important if you cannot feel pain, heat, or cold. You may have a greater risk of getting burned.  Take over-the-counter and prescription medicines only as told by your doctor.  Return to your normal activities as told by your doctor. Ask your doctor what activities are safe for you.  Keep all follow-up visits as told by your doctor. This is important. Contact a doctor if:  You have chills or a fever.  Your pain does not go away or it gets worse.  You have a cough that does not go away. Get help right away if:  You are short of breath. This information is not intended to replace advice given to you by your health care provider. Make sure you discuss any questions you have with your health care provider. Document Released: 10/19/2007 Document Revised: 05/17/2017 Document Reviewed: 08/26/2015 Elsevier Patient Education  2020 [employer].

## 2018-12-12 ENCOUNTER — Encounter: Payer: Self-pay | Admitting: *Deleted

## 2018-12-12 ENCOUNTER — Telehealth: Payer: Self-pay | Admitting: Cardiology

## 2018-12-12 NOTE — Telephone Encounter (Signed)
Has questions about a statin drug

## 2018-12-12 NOTE — Telephone Encounter (Signed)
Patient states that she had recent lab work done at [facility], NP's office and she was advised to increase her pravastatin dose from 10 mg daily to 40 mg daily due to increased triglyceride levels. Patient is very hesitant with this change due to her fibromyalgia and wants Dr. Joya Gaskins opinion before making this medication change. Most recent lab work from PCP have been requested for review. Once records are received will have Dr. Bettina Gavia advise.

## 2018-12-13 NOTE — Telephone Encounter (Signed)
Lab work is in to be reviewed folder Dr. Bettina Gavia. Please advise. Thanks!

## 2018-12-13 NOTE — Telephone Encounter (Signed)
I think a better choice would be to start fish oil I prefer use the prescription Lovaza and I think 1 twice daily would have a market effect both on triglycerides and cholesterol.  If you have trouble regurgitating and tasting fish you can freeze them and they will not dissolve until you are out of your stomach.  If agreeable give her a 90day prescription and another lipid profile fasting in about 6 weeks

## 2018-12-14 NOTE — Telephone Encounter (Signed)
Patient is hesitant about agreeing to alternative medication recommendations. She wants to check with her insurance company to see how much lovaza will cost because she states "I really can't afford to pay anymore than what I am paying now for my medicine." She will contact our office in the future if she wants to make any medication changes. No further questions.

## 2018-12-26 ENCOUNTER — Telehealth: Payer: Self-pay | Admitting: Cardiology

## 2018-12-26 DIAGNOSIS — E782 Mixed hyperlipidemia: Secondary | ICD-10-CM

## 2018-12-26 MED ORDER — OMEGA-3-ACID ETHYL ESTERS 1 G PO CAPS: 1.0000 g | ORAL_CAPSULE | Freq: Two times a day (BID) | ORAL | 3 refills | Status: DC

## 2018-12-26 NOTE — Telephone Encounter (Signed)
Patient had multiple questions regarding switching from pravastatin to lovaza 1 gram twice daily. Laurann Montana, NP and I answered these questions and patient wants to stop pravastatin at this time and start [place]. Prescription has been sent to [facility] as requested. Patient will come to the [facility] office for lab work 6 weeks after starting lovaza. Patient is agreeable and verbalized understanding. No further questions.

## 2019-02-19 ENCOUNTER — Telehealth: Payer: Self-pay | Admitting: Neurology

## 2019-02-19 NOTE — Telephone Encounter (Signed)
Pt would like provider to call her and explain the MRI results to her. Pt would also like for it to be explained to her why she was scheduled with the NP. Please advise.

## 2019-02-20 NOTE — Telephone Encounter (Signed)
I called pt.  I relayed that MRI brain ordered by her opthamologist or pcp would need to give her results. [facility]).  She felt like she needed more clarification on the results.  I told her to call ordering provider and ask to speak to them for more clarification.  If when she come into the office here for appt (bring CD/results) and this could be addressed.  Has appt in one yr with NP.  She wanted to see MD.  I told her that NP ae extenders of MD, and if they think pt is stable they will be placed with NP.  I did not change the appt.  I felt like her main concern was the MRI results.  She will call back as needed.

## 2019-03-01 LAB — LIPID PANEL
Chol/HDL Ratio: 4.3 ratio (ref 0.0–4.4)
Cholesterol, Total: 240 mg/dL — ABNORMAL HIGH (ref 100–199)
HDL: 56 mg/dL (ref >39)
LDL Chol Calc (NIH): 158 mg/dL — ABNORMAL HIGH (ref 0–99)
Triglycerides: 147 mg/dL (ref 0–149)
VLDL Cholesterol Cal: 26 mg/dL (ref 5–40)

## 2019-03-08 ENCOUNTER — Telehealth: Payer: Self-pay

## 2019-03-08 DIAGNOSIS — E785 Hyperlipidemia, unspecified: Secondary | ICD-10-CM

## 2019-03-08 NOTE — Telephone Encounter (Signed)
Left message for patient to return call to office for results.

## 2019-03-12 ENCOUNTER — Telehealth: Payer: Self-pay | Admitting: Cardiology

## 2019-03-12 MED ORDER — EZETIMIBE 10 MG PO TABS: 10.0000 mg | ORAL_TABLET | Freq: Every day | ORAL | 3 refills | Status: DC

## 2019-03-12 NOTE — Addendum Note (Signed)
Addended by: Austin Miles on: 03/12/2019 03:29 PM   Modules accepted: Orders

## 2019-03-12 NOTE — Telephone Encounter (Signed)
Duplicate encounter. Please see most recent encounter.  

## 2019-03-12 NOTE — Telephone Encounter (Signed)
Patient informed of lab results and advised that Dr. Bettina Gavia recommends adding zetia 10 mg daily. Patient is agreeable and prescription has been sent to [facility] in [place] as requested. Patient will return to our office in 6 weeks for repeat lab work, no appointment needed. She will fast beforehand. Patient is agreeable to plan and verbalized understanding. No further questions.

## 2019-03-12 NOTE — Telephone Encounter (Signed)
Patient has questions about labwork, Please call her

## 2019-03-28 ENCOUNTER — Ambulatory Visit: Payer: 59 | Admitting: Adult Health

## 2019-04-15 ENCOUNTER — Telehealth: Payer: Self-pay | Admitting: Cardiology

## 2019-04-15 NOTE — Telephone Encounter (Signed)
Please call patient regarding her Ezetimibe 10mg , she has a question.Marland Kitchen

## 2019-04-16 MED ORDER — EZETIMIBE 10 MG PO TABS: 10.0000 mg | ORAL_TABLET | Freq: Every day | ORAL | 1 refills | Status: DC

## 2019-04-16 NOTE — Addendum Note (Signed)
Addended by: Austin Miles on: 04/16/2019 03:01 PM   Modules accepted: Orders

## 2019-04-16 NOTE — Telephone Encounter (Signed)
Patient called back and has more questions regarding her Ezetimibe.

## 2019-04-16 NOTE — Telephone Encounter (Signed)
Returned patient's call and sent refill for zetia to [facility] as requested. No further questions.

## 2019-04-18 ENCOUNTER — Other Ambulatory Visit: Payer: Self-pay

## 2019-04-18 MED ORDER — EZETIMIBE 10 MG PO TABS: 10.0000 mg | ORAL_TABLET | Freq: Every day | ORAL | 0 refills | Status: DC

## 2019-04-18 NOTE — Telephone Encounter (Signed)
Ezetimibe refill sent to [facility].

## 2019-05-06 ENCOUNTER — Encounter: Payer: Self-pay | Admitting: Gastroenterology

## 2019-06-13 ENCOUNTER — Telehealth: Payer: Self-pay

## 2019-06-13 LAB — LIPID PANEL
Chol/HDL Ratio: 3.4 ratio (ref 0.0–4.4)
Cholesterol, Total: 206 mg/dL — ABNORMAL HIGH (ref 100–199)
HDL: 60 mg/dL (ref >39)
LDL Chol Calc (NIH): 120 mg/dL — ABNORMAL HIGH (ref 0–99)
Triglycerides: 148 mg/dL (ref 0–149)
VLDL Cholesterol Cal: 26 mg/dL (ref 5–40)

## 2019-06-13 NOTE — Telephone Encounter (Signed)
The patient has been notified of the result and verbalized understanding.  All questions (if any) were answered. Leanord Hawking, RN 06/13/2019 4:47 PM

## 2019-06-13 NOTE — Telephone Encounter (Signed)
-----   Message from Baldo Daub, MD sent at 06/13/2019  4:42 PM EST ----- Normal or stable result  Her lipids are improved and I continue her current treatment

## 2019-09-26 ENCOUNTER — Other Ambulatory Visit: Payer: Self-pay | Admitting: Cardiology

## 2019-09-26 MED ORDER — EZETIMIBE 10 MG PO TABS: 10.0000 mg | ORAL_TABLET | Freq: Every day | ORAL | 1 refills | Status: DC

## 2019-09-26 NOTE — Telephone Encounter (Signed)
Refill sent for Zetia.  

## 2019-09-26 NOTE — Telephone Encounter (Signed)
*  STAT* If patient is at the pharmacy, call can be transferred to refill team.   1. Which medications need to be refilled? (please list name of each medication and dose if known) ezetimibe (ZETIA) 10 MG tablet  2. Which pharmacy/location (including street and city if local pharmacy) is medication to be sent to? [facility] - [place], [place] - [address]  3. Do they need a 30 day or 90 day supply? 90 day

## 2019-10-02 ENCOUNTER — Other Ambulatory Visit: Payer: Self-pay | Admitting: *Deleted

## 2019-10-02 MED ORDER — OMEGA-3-ACID ETHYL ESTERS 1 G PO CAPS: 1.0000 g | ORAL_CAPSULE | Freq: Two times a day (BID) | ORAL | 0 refills | Status: DC

## 2019-10-02 NOTE — Telephone Encounter (Signed)
Received fax request for Omega 3. Rx was sent to pharmacy per request for #90 and 0 refills

## 2019-11-14 ENCOUNTER — Telehealth: Payer: Self-pay | Admitting: Cardiology

## 2019-11-14 DIAGNOSIS — I2510 Atherosclerotic heart disease of native coronary artery without angina pectoris: Secondary | ICD-10-CM

## 2019-11-14 DIAGNOSIS — R079 Chest pain, unspecified: Secondary | ICD-10-CM

## 2019-11-14 DIAGNOSIS — R030 Elevated blood-pressure reading, without diagnosis of hypertension: Secondary | ICD-10-CM

## 2019-11-14 NOTE — Telephone Encounter (Signed)
Yes CMP and lipid profile

## 2019-11-14 NOTE — Telephone Encounter (Signed)
Spoke to patient just now and let her know that I placed the orders for her to have her labs completed prior to her appointment. I advised her to come in about 2-3 days prior to her appointment to get these done. She verbalizes understanding and thanks me for the call back.    Encouraged patient to call back with any questions or concerns.

## 2019-11-14 NOTE — Telephone Encounter (Signed)
Patient wanted to know if Dr. Dulce Sellar wanted there to have lab work done before her appointment in August. If so, please put the orders in and she will have them done closer to her August appointment

## 2019-11-25 ENCOUNTER — Ambulatory Visit: Payer: POS | Admitting: Cardiology

## 2019-12-27 LAB — COMPREHENSIVE METABOLIC PANEL
ALT: 21 IU/L (ref 0–32)
AST: 19 IU/L (ref 0–40)
Albumin/Globulin Ratio: 1.8 (ref 1.2–2.2)
Albumin: 4.7 g/dL (ref 3.8–4.8)
Alkaline Phosphatase: 78 IU/L (ref 48–121)
BUN/Creatinine Ratio: 28 (ref 12–28)
BUN: 20 mg/dL (ref 8–27)
Bilirubin Total: 0.5 mg/dL (ref 0.0–1.2)
CO2: 24 mmol/L (ref 20–29)
Calcium: 9.8 mg/dL (ref 8.7–10.3)
Chloride: 104 mmol/L (ref 96–106)
Creatinine, Ser: 0.71 mg/dL (ref 0.57–1.00)
GFR calc Af Amer: 106 mL/min/{1.73_m2} (ref >59)
GFR calc non Af Amer: 92 mL/min/{1.73_m2} (ref >59)
Globulin, Total: 2.6 g/dL (ref 1.5–4.5)
Glucose: 101 mg/dL — ABNORMAL HIGH (ref 65–99)
Potassium: 4.6 mmol/L (ref 3.5–5.2)
Sodium: 140 mmol/L (ref 134–144)
Total Protein: 7.3 g/dL (ref 6.0–8.5)

## 2019-12-27 LAB — LIPID PANEL
Chol/HDL Ratio: 3.7 ratio (ref 0.0–4.4)
Cholesterol, Total: 197 mg/dL (ref 100–199)
HDL: 53 mg/dL (ref >39)
LDL Chol Calc (NIH): 128 mg/dL — ABNORMAL HIGH (ref 0–99)
Triglycerides: 86 mg/dL (ref 0–149)
VLDL Cholesterol Cal: 16 mg/dL (ref 5–40)

## 2019-12-30 ENCOUNTER — Other Ambulatory Visit: Payer: Self-pay

## 2019-12-30 DIAGNOSIS — M25551 Pain in right hip: Secondary | ICD-10-CM | POA: Insufficient documentation

## 2019-12-30 DIAGNOSIS — R112 Nausea with vomiting, unspecified: Secondary | ICD-10-CM

## 2019-12-30 DIAGNOSIS — F419 Anxiety disorder, unspecified: Secondary | ICD-10-CM | POA: Insufficient documentation

## 2019-12-30 DIAGNOSIS — M5136 Other intervertebral disc degeneration, lumbar region: Secondary | ICD-10-CM

## 2019-12-30 DIAGNOSIS — Z9889 Other specified postprocedural states: Secondary | ICD-10-CM

## 2019-12-30 DIAGNOSIS — F329 Major depressive disorder, single episode, unspecified: Secondary | ICD-10-CM | POA: Insufficient documentation

## 2019-12-30 DIAGNOSIS — K760 Fatty (change of) liver, not elsewhere classified: Secondary | ICD-10-CM

## 2019-12-30 DIAGNOSIS — M51369 Other intervertebral disc degeneration, lumbar region without mention of lumbar back pain or lower extremity pain: Secondary | ICD-10-CM

## 2019-12-30 DIAGNOSIS — F32A Depression, unspecified: Secondary | ICD-10-CM | POA: Insufficient documentation

## 2019-12-30 DIAGNOSIS — R413 Other amnesia: Secondary | ICD-10-CM | POA: Insufficient documentation

## 2019-12-30 DIAGNOSIS — K259 Gastric ulcer, unspecified as acute or chronic, without hemorrhage or perforation: Secondary | ICD-10-CM

## 2019-12-30 HISTORY — DX: Other specified postprocedural states: Z98.890

## 2019-12-30 HISTORY — DX: Other intervertebral disc degeneration, lumbar region: M51.36

## 2019-12-30 HISTORY — DX: Nausea with vomiting, unspecified: R11.2

## 2019-12-30 HISTORY — DX: Other intervertebral disc degeneration, lumbar region without mention of lumbar back pain or lower extremity pain: M51.369

## 2019-12-30 HISTORY — DX: Gastric ulcer, unspecified as acute or chronic, without hemorrhage or perforation: K25.9

## 2019-12-30 HISTORY — DX: Fatty (change of) liver, not elsewhere classified: K76.0

## 2019-12-31 NOTE — Progress Notes (Signed)
Cardiology Office Note:    Date:  01/01/2020   ID:  Kimberly Swanson, DOB 29-May-1960, MRN [id number]  PCP:  Hurshel Party, NP  Cardiologist:  Norman Herrlich, MD    Referring MD: Hurshel Party, NP    ASSESSMENT:    1. Mild CAD   2. Elevated coronary artery calcium score   3. Elevated blood pressure reading without diagnosis of hypertension   4. Mixed hyperlipidemia   5. Statin intolerance    PLAN:    In order of problems listed above:  1. Stable CAD having no angina continue medical therapy including low-dose aspirin Zetia and intensified lipid-lowering with PCSK9 inhibitor. 2. Add PCSK9 inhibitor goal LDL less than 70 ideally less than 55 3. Stable BP at target 4. Poorly controlled severe residual LDL elevation continue Zetia initiate PCSK9 therapy I will check a lipid profile in 1 month and handoff to her primary care physician   Next appointment: 1 year   Medication Adjustments/Labs and Tests Ordered: Current medicines are reviewed at length with the patient today.  Concerns regarding medicines are outlined above.  No orders of the defined types were placed in this encounter.  No orders of the defined types were placed in this encounter.   Chief Complaint  Patient presents with  . Follow-up  . Coronary Artery Disease  . Hyperlipidemia    History of Present Illness:    Kimberly Swanson is a 59 y.o. female with a hx of chest pain with mild nonobstructive CAD last seen 11/15/2018.  She had a cardiac CTA 11/06/2018 her calcium score was 59 -85th percentile for age and sex control and had mild plaque in the proximal LAD 25 to 49%.  Compliance with diet, lifestyle and medications: Yes  She has severe unremitting back pain and takes a nonsteroidal anti-inflammatory drug Lodine.  She has had no angina dyspnea palpitation or syncope.  She is intolerant of all statins she has fibromyalgia also her LDL remains severely elevated 128 and after discussion of benefits and risk we will try  Repatha along with her Zetia.  She is concerned about taking aspirin but I told her I think in her case it is important if she does not get any cardiovascular benefit from the nonsteroidal anti-inflammatory drugs and their action additional cardiovascular risk factor.  Past Medical History:  Diagnosis Date  . Anxiety   . Bilateral sciatica 02/11/2015  . Costochondritis 11/15/2018  . DDD (degenerative disc disease), lumbar 12/30/2019  . Depression   . Epigastric pain 03/14/2018  . Fatty liver 12/30/2019  . Fibromyalgia   . Gastro-esophageal reflux disease without esophagitis 03/14/2018  . History of colonic polyps 03/14/2018  . Irritable bowel syndrome with constipation 03/14/2018  . Memory loss   . Multiple gastric ulcers 12/30/2019   Formatting of this note might be different from the original. age 44  . PONV (postoperative nausea and vomiting) 12/30/2019  . Symptomatic menopausal or female climacteric states 05/11/2016    Past Surgical History:  Procedure Laterality Date  . ABDOMINAL HYSTERECTOMY    . BLADDER SURGERY    . CHOLECYSTECTOMY    . CYSTOCELE REPAIR    . RECTOCELE REPAIR    . VESICOVAGINAL FISTULA CLOSURE W/ TAH  2012    Current Medications: Current Meds  Medication Sig  . acetaminophen (TYLENOL 8 HOUR ARTHRITIS PAIN) 650 MG CR tablet Take 650 mg by mouth 2 (two) times daily.  Marland Kitchen ALPRAZolam (XANAX) 0.25 MG tablet Take 0.25 mg by mouth  daily as needed.   Marland Kitchen aspirin EC 81 MG tablet Take 81 mg by mouth daily.  Marland Kitchen b complex vitamins tablet Take 1 tablet by mouth daily.  Marland Kitchen buPROPion (WELLBUTRIN XL) 150 MG 24 hr tablet Take 150 mg by mouth daily.   . Calcium Carb-Cholecalciferol (CALTRATE 600+D3 PO) Take 1 tablet by mouth 2 (two) times daily.  . COLLAGEN PO Take 1 tablet by mouth daily.  Marland Kitchen estradiol (CLIMARA - DOSED IN MG/24 HR) 0.1 mg/24hr patch Place 1 patch onto the skin once a week.  . etodolac (LODINE) 400 MG tablet TAKE 1 TABLET TWICE A DAY  . ezetimibe (ZETIA) 10 MG  tablet Take 1 tablet (10 mg total) by mouth daily.  . fluticasone (FLONASE) 50 MCG/ACT nasal spray Place into the nose.  . gabapentin (NEURONTIN) 800 MG tablet Take 1 tablet (800 mg total) by mouth at bedtime.  . Omega-3 1000 MG CAPS Take 1,000 mg by mouth daily.  Marland Kitchen omega-3 acid ethyl esters (LOVAZA) 1 g capsule Take 1 capsule (1 g total) by mouth 2 (two) times daily.  Marland Kitchen OMEPRAZOLE PO Take 20 mg by mouth every other day.  . polyethylene glycol (MIRALAX / GLYCOLAX) packet Take 1 packet by mouth daily.  . vitamin B-12 (CYANOCOBALAMIN) 100 MCG tablet Take 100 mcg by mouth daily.     Allergies:   Codeine   Social History   Socioeconomic History  . Marital status: Married    Spouse name: Not on file  . Number of children: Not on file  . Years of education: Not on file  . Highest education level: Not on file  Occupational History  . Not on file  Tobacco Use  . Smoking status: Never Smoker  . Smokeless tobacco: Never Used  Vaping Use  . Vaping Use: Never used  Substance and Sexual Activity  . Alcohol use: Never    Alcohol/week: 0.0 standard drinks  . Drug use: Never  . Sexual activity: Not on file  Other Topics Concern  . Not on file  Social History Narrative  . Not on file   Social Determinants of Health   Financial Resource Strain:   . Difficulty of Paying Living Expenses:   Food Insecurity:   . Worried About Programme researcher, broadcasting/film/video in the Last Year:   . Barista in the Last Year:   Transportation Needs:   . Freight forwarder (Medical):   Marland Kitchen Lack of Transportation (Non-Medical):   Physical Activity:   . Days of Exercise per Week:   . Minutes of Exercise per Session:   Stress:   . Feeling of Stress :   Social Connections:   . Frequency of Communication with Friends and Family:   . Frequency of Social Gatherings with Friends and Family:   . Attends Religious Services:   . Active Member of Clubs or Organizations:   . Attends Banker Meetings:   Marland Kitchen  Marital Status:      Family History: The patient's family history includes Heart attack in her maternal grandmother; Irregular heart beat in her mother; Lung cancer in her father. ROS:   Please see the history of present illness.    All other systems reviewed and are negative.  EKGs/Labs/Other Studies Reviewed:    The following studies were reviewed today:  EKG:  EKG ordered today and personally reviewed.  The ekg ordered today demonstrates sinus rhythm and is normal  Recent Labs: 12/27/2019: ALT 21; BUN 20; Creatinine, Ser 0.71;  Potassium 4.6; Sodium 140  Recent Lipid Panel    Component Value Date/Time   CHOL 197 12/27/2019 0957   TRIG 86 12/27/2019 0957   HDL 53 12/27/2019 0957   CHOLHDL 3.7 12/27/2019 0957   LDLCALC 128 (H) 12/27/2019 0957    Physical Exam:    VS:  BP 122/74   Pulse 74   Ht 5\' 5"  (1.651 m)   Wt 149 lb (67.6 kg)   SpO2 98%   BMI 24.79 kg/m     Wt Readings from Last 3 Encounters:  01/01/20 149 lb (67.6 kg)  11/15/18 153 lb (69.4 kg)  08/29/18 153 lb (69.4 kg)     GEN:  Well nourished, well developed in no acute distress HEENT: Normal NECK: No JVD; No carotid bruits LYMPHATICS: No lymphadenopathy CARDIAC: RRR, no murmurs, rubs, gallops RESPIRATORY:  Clear to auscultation without rales, wheezing or rhonchi  ABDOMEN: Soft, non-tender, non-distended MUSCULOSKELETAL:  No edema; No deformity  SKIN: Warm and dry NEUROLOGIC:  Alert and oriented x 3 PSYCHIATRIC:  Normal affect    Signed, 08/31/18, MD  01/01/2020 1:33 PM    [facility]

## 2020-01-01 ENCOUNTER — Ambulatory Visit: Payer: POS | Admitting: Cardiology

## 2020-01-01 ENCOUNTER — Other Ambulatory Visit: Payer: Self-pay

## 2020-01-01 ENCOUNTER — Ambulatory Visit (INDEPENDENT_AMBULATORY_CARE_PROVIDER_SITE_OTHER): Payer: POS | Admitting: Cardiology

## 2020-01-01 ENCOUNTER — Encounter: Payer: Self-pay | Admitting: Cardiology

## 2020-01-01 VITALS — BP 122/74 | HR 74 | Ht 65.0 in | Wt 149.0 lb

## 2020-01-01 DIAGNOSIS — Z789 Other specified health status: Secondary | ICD-10-CM

## 2020-01-01 DIAGNOSIS — R931 Abnormal findings on diagnostic imaging of heart and coronary circulation: Secondary | ICD-10-CM

## 2020-01-01 DIAGNOSIS — E782 Mixed hyperlipidemia: Secondary | ICD-10-CM

## 2020-01-01 DIAGNOSIS — R030 Elevated blood-pressure reading, without diagnosis of hypertension: Secondary | ICD-10-CM

## 2020-01-01 DIAGNOSIS — I2510 Atherosclerotic heart disease of native coronary artery without angina pectoris: Secondary | ICD-10-CM | POA: Diagnosis not present

## 2020-01-01 MED ORDER — EZETIMIBE 10 MG PO TABS: 10.0000 mg | ORAL_TABLET | Freq: Every day | ORAL | 1 refills | Status: DC

## 2020-01-01 MED ORDER — ASPIRIN EC 81 MG PO TBEC: 81.0000 mg | DELAYED_RELEASE_TABLET | Freq: Every day | ORAL | 3 refills | Status: AC

## 2020-01-01 MED ORDER — OMEGA-3-ACID ETHYL ESTERS 1 G PO CAPS: 1.0000 g | ORAL_CAPSULE | Freq: Two times a day (BID) | ORAL | 0 refills | Status: DC

## 2020-01-01 NOTE — Patient Instructions (Signed)
Medication Instructions:  Your physician recommends that you continue on your current medications as directed. Please refer to the Current Medication list given to you today.  *If you need a refill on your cardiac medications before your next appointment, please call your pharmacy*   Lab Work: Your physician recommends that you return for lab work in: 1 month after starting Repatha If you have labs (blood work) drawn today and your tests are completely normal, you will receive your results only by:  MyChart Message (if you have MyChart) OR  A paper copy in the mail If you have any lab test that is abnormal or we need to change your treatment, we will call you to review the results.   Testing/Procedures: None   Follow-Up: At [facility], you and your health needs are our priority.  As part of our continuing mission to provide you with exceptional heart care, we have created designated Provider Care Teams.  These Care Teams include your primary Cardiologist (physician) and Advanced Practice Providers (APPs -  Physician Assistants and Nurse Practitioners) who all work together to provide you with the care you need, when you need it.  We recommend signing up for the patient portal called "MyChart".  Sign up information is provided on this After Visit Summary.  MyChart is used to connect with patients for Virtual Visits (Telemedicine).  Patients are able to view lab/test results, encounter notes, upcoming appointments, etc.  Non-urgent messages can be sent to your provider as well.   To learn more about what you can do with MyChart, go to [email or web address].    Your next appointment:   1 year(s)  The format for your next appointment:   In Person  Provider:   Norman Herrlich, MD   Other Instructions We have put in a referral for you to speak with our lipid clinic. They will help you to get started on Repatha. They will call you to schedule this appointment.

## 2020-01-01 NOTE — Addendum Note (Signed)
Addended by: Delorse Limber I on: 01/01/2020 03:09 PM   Modules accepted: Orders

## 2020-01-07 ENCOUNTER — Other Ambulatory Visit: Payer: Self-pay | Admitting: Cardiology

## 2020-01-07 MED ORDER — EZETIMIBE 10 MG PO TABS: 10.0000 mg | ORAL_TABLET | Freq: Every day | ORAL | 3 refills | Status: DC

## 2020-01-07 NOTE — Telephone Encounter (Signed)
Refill sent in per request.  

## 2020-01-07 NOTE — Telephone Encounter (Signed)
   *  STAT* If patient is at the pharmacy, call can be transferred to refill team.   1. Which medications need to be refilled? (please list name of each medication and dose if known)   ezetimibe (ZETIA) 10 MG tablet     2. Which pharmacy/location (including street and city if local pharmacy) is medication to be sent to? [facility] - [place], [place] - [address]  3. Do they need a 30 day or 90 day supply? 90 days   Pt said to [facility]

## 2020-01-29 ENCOUNTER — Ambulatory Visit (INDEPENDENT_AMBULATORY_CARE_PROVIDER_SITE_OTHER): Payer: POS | Admitting: Pharmacist

## 2020-01-29 ENCOUNTER — Other Ambulatory Visit: Payer: Self-pay

## 2020-01-29 VITALS — BP 136/84 | HR 90 | Ht 65.0 in | Wt 151.4 lb

## 2020-01-29 DIAGNOSIS — E785 Hyperlipidemia, unspecified: Secondary | ICD-10-CM

## 2020-01-29 NOTE — Patient Instructions (Addendum)
Your Results:             Your most recent labs Goal  Total Cholesterol 197 < 200  Triglycerides 86 < 150  HDL (good cholesterol) 53 > 40  LDL (bad cholesterol 128 < 70 (ideal <55)   Medication changes: *START prior-authorization for  Repatha SureClick 140mg  every 14 days*  Lab orders: *Plan to repeat fating blood work after 4 doses of new medication  Clinic phone number: *Kolby Myung/Kristin/Haliegh [phone]  Patient Assistance:   *Co-pay card provided - please enroll before use  Thank you for choosing [facility]

## 2020-01-29 NOTE — Progress Notes (Signed)
Patient ID: Kimberly Swanson                 DOB: Nov 16, 1960                    MRN: [id number]     HPI:  Kimberly Swanson is a 59 y.o. female patient referred to lipid clinic by Dr. Dulce Sellar. PMH is significant for hx of chest pain, fibromyalgia, degenerative disc disease, depression, fatty liver, CAD with coronary calcium score of 59. This was 78 percentile for age and sex matched control. Noted intolerance to statins and and currently on ezetimibe therapy. Patient presents for potential PCSK9i initiation and counseling  Current Medications:   Ezetimibe 10mg  daily Omega-3 (lovaza) 1 gram twice daily  Intolerances: Pravastatin 10mg  daily - worsen arm pain and leg  LDL goal: < 70mg /dL (55 mg/dL)  Diet: low dairy, no cheese, non-fat yoghurt, small amount of beef, mainly home cooked.  Exercise: walks 20-30 minutes three times per week  Family History: The patient's family history includes Heart attack in her maternal grandmother; Irregular heart beat in her mother; Lung cancer in her father.  Social History: denies tobacco and alcohol use  Labs:   Past Medical History:  Diagnosis Date   Anxiety    Bilateral sciatica 02/11/2015   Costochondritis 11/15/2018   DDD (degenerative disc disease), lumbar 12/30/2019   Depression    Epigastric pain 03/14/2018   Fatty liver 12/30/2019   Fibromyalgia    Gastro-esophageal reflux disease without esophagitis 03/14/2018   History of colonic polyps 03/14/2018   Irritable bowel syndrome with constipation 03/14/2018   Memory loss    Multiple gastric ulcers 12/30/2019   Formatting of this note might be different from the original. age 80   PONV (postoperative nausea and vomiting) 12/30/2019   Symptomatic menopausal or female climacteric states 05/11/2016    Current Outpatient Medications on File Prior to Visit  Medication Sig Dispense Refill   acetaminophen (TYLENOL 8 HOUR ARTHRITIS PAIN) 650 MG CR tablet Take 650 mg by mouth 2 (two) times  daily.     ALPRAZolam (XANAX) 0.25 MG tablet Take 0.25 mg by mouth daily as needed.      aspirin EC 81 MG tablet Take 1 tablet (81 mg total) by mouth daily. 90 tablet 3   buPROPion (WELLBUTRIN XL) 150 MG 24 hr tablet Take 150 mg by mouth daily.      Calcium Carb-Cholecalciferol (CALTRATE 600+D3 PO) Take 1 tablet by mouth 2 (two) times daily.     estradiol (CLIMARA - DOSED IN MG/24 HR) 0.1 mg/24hr patch Place 1 patch onto the skin once a week.     etodolac (LODINE) 400 MG tablet TAKE 1 TABLET TWICE A DAY 180 tablet 3   ezetimibe (ZETIA) 10 MG tablet Take 1 tablet (10 mg total) by mouth daily. 90 tablet 3   fluticasone (FLONASE) 50 MCG/ACT nasal spray Place into the nose.     gabapentin (NEURONTIN) 800 MG tablet Take 1 tablet (800 mg total) by mouth at bedtime. 90 tablet 3   magnesium 30 MG tablet Take 30 mg by mouth 2 (two) times daily.     omega-3 acid ethyl esters (LOVAZA) 1 g capsule Take 1 capsule (1 g total) by mouth 2 (two) times daily. 180 capsule 0   pantoprazole (PROTONIX) 40 MG tablet Take 40 mg by mouth 2 (two) times daily.     polyethylene glycol (MIRALAX / GLYCOLAX) packet Take 1 packet by mouth daily.  vitamin B-12 (CYANOCOBALAMIN) 100 MCG tablet Take 100 mcg by mouth daily.     No current facility-administered medications on file prior to visit.    Allergies  Allergen Reactions   Codeine Nausea And Vomiting   Pravastatin     myalgia    Hyperlipidemia LDL remains above goal for secondary prevention while on ezetimibe 10mg  daily. Patient also follows low fat dient, and walks 2-30 minutes three times per week. She is intolerant to statins with failure to pravastatin 10mg  daily and unable to try high intensity d/t worsening fibromyalgia symptoms.    PCSk9i therapy was discussed including, indication, prior authorization process, storage, administration, common side effetcs, and follow up. Repatha Sureclick 140mg  sample (LOT , Exp 4/23) was provide for  patient self administration. Will proceed with PA paperwork and follow up with patient as needed. Co-pay card was also provided and plan to repeat fasting blood work after 4 doses of new medication.  Laquisha Northcraft Rodriguez-Guzman PharmD, BCPS, CPP [facility] [address] [place] [facility] 02/02/2020 6:39 PM

## 2020-01-30 ENCOUNTER — Telehealth: Payer: Self-pay | Admitting: Cardiology

## 2020-01-30 NOTE — Telephone Encounter (Signed)
Will forward to the Lipid clinic... pt seen yesterday.... unclear if this has to do with her having authorization for Repatha.

## 2020-01-30 NOTE — Telephone Encounter (Signed)
New Message  Amy is calling from [facility] pharmacy and says she shows the patient is not taking a statin and is wanting to follow up on the status   Please call

## 2020-01-31 ENCOUNTER — Telehealth: Payer: Self-pay | Admitting: Cardiology

## 2020-01-31 NOTE — Telephone Encounter (Signed)
Amy from [facility] is calling due to noticing the patient is not on a statin drug and is wanting to discuss it further because of her medical history. Please advise.

## 2020-02-02 ENCOUNTER — Encounter: Payer: Self-pay | Admitting: Pharmacist

## 2020-02-02 NOTE — Assessment & Plan Note (Signed)
LDL remains above goal for secondary prevention while on ezetimibe 10mg  daily. Patient also follows low fat dient, and walks 2-30 minutes three times per week. She is intolerant to statins with failure to pravastatin 10mg  daily and unable to try high intensity d/t worsening fibromyalgia symptoms.    PCSk9i therapy was discussed including, indication, prior authorization process, storage, administration, common side effetcs, and follow up. Repatha Sureclick 140mg  sample (LOT , Exp 4/23) was provide for patient self administration. Will proceed with PA paperwork and follow up with patient as needed. Co-pay card was also provided and plan to repeat fasting blood work after 4 doses of new medication.

## 2020-02-03 NOTE — Telephone Encounter (Signed)
Spoke with [facility] at [facility] and made her aware that the pt is intolerant to statins and that she will be starting repatha.

## 2020-02-07 ENCOUNTER — Telehealth: Payer: Self-pay

## 2020-02-07 DIAGNOSIS — E785 Hyperlipidemia, unspecified: Secondary | ICD-10-CM

## 2020-02-07 NOTE — Telephone Encounter (Signed)
Pt called to inquire about Repatha approval.  Please assist, thank you

## 2020-02-10 NOTE — Telephone Encounter (Signed)
Called and lmomed the pt that we are currently working on an appeal and that was denied due to only intolerant to one statin and to call us back if they could remember another statin that they had tried

## 2020-02-12 NOTE — Telephone Encounter (Signed)
Pt called in and stated that they understood they needed to try another statin to get approved for pcsk9 therapy she really doesn't want to try another statin but would agree to try it if it helped her chances at getting repatha. I will route to raquel pharmd.

## 2020-02-13 MED ORDER — REPATHA SURECLICK 140 MG/ML ~~LOC~~ SOAJ: 140.0000 mg | SUBCUTANEOUS | 11 refills | Status: DC

## 2020-02-13 NOTE — Telephone Encounter (Signed)
Appeal approved , please follow up with patient

## 2020-02-13 NOTE — Telephone Encounter (Signed)
Called and spoke w/pt, stated that she is approved for repatha and rx sent, instructed the pt to apply for the copay card that was given to her at the time of her visit, pt voiced understanding. I also told the pt that they would need lipid fasting labs after their 4th injection in 2 mos and I ordered the labs and instructed her that an appt isn't needed.

## 2020-02-13 NOTE — Addendum Note (Signed)
Addended by: Eather Colas on: 02/13/2020 02:17 PM   Modules accepted: Orders

## 2020-03-13 ENCOUNTER — Other Ambulatory Visit: Payer: Self-pay | Admitting: Cardiology

## 2020-03-30 ENCOUNTER — Other Ambulatory Visit: Payer: Self-pay

## 2020-03-30 ENCOUNTER — Telehealth: Payer: Self-pay

## 2020-03-30 DIAGNOSIS — Z789 Other specified health status: Secondary | ICD-10-CM

## 2020-03-30 DIAGNOSIS — I2510 Atherosclerotic heart disease of native coronary artery without angina pectoris: Secondary | ICD-10-CM

## 2020-03-30 DIAGNOSIS — E785 Hyperlipidemia, unspecified: Secondary | ICD-10-CM

## 2020-03-30 DIAGNOSIS — E782 Mixed hyperlipidemia: Secondary | ICD-10-CM

## 2020-03-30 LAB — LIPID PANEL
Chol/HDL Ratio: 1.9 ratio (ref 0.0–4.4)
Cholesterol, Total: 124 mg/dL (ref 100–199)
HDL: 65 mg/dL (ref >39)
LDL Chol Calc (NIH): 41 mg/dL (ref 0–99)
Triglycerides: 96 mg/dL (ref 0–149)
VLDL Cholesterol Cal: 18 mg/dL (ref 5–40)

## 2020-03-30 NOTE — Telephone Encounter (Signed)
-----   Message from Baldo Daub, MD sent at 03/30/2020  4:41 PM EST ----- Randie Heinz results continue Repatha

## 2020-03-30 NOTE — Telephone Encounter (Signed)
Left the patient a message to call back regarding recent lab results.

## 2020-03-31 ENCOUNTER — Telehealth: Payer: Self-pay

## 2020-03-31 NOTE — Telephone Encounter (Signed)
Left message on patients voicemail to please return our call.   

## 2020-03-31 NOTE — Telephone Encounter (Signed)
-----   Message from Brian J Munley, MD sent at 03/30/2020  4:41 PM EST ----- Great results continue Repatha 

## 2020-04-01 NOTE — Telephone Encounter (Signed)
Patient returning call.

## 2020-04-05 IMAGING — CT CT HEAR MORPH WITH CTA COR WITH SCORE WITH CA WITH CONTRAST AND
4 of 7 series · 8 of 20 positions shown, 9 images · non-contrast
Comparison: None.

Addendum:
CLINICAL DATA: 58-year-old female with atypical chest pain.

EXAM:
Cardiac/Coronary  CT
TECHNIQUE: The patient was scanned on a Phillips Force scanner.

[Series 6: best diast 73 % · axial · 0.39mm/px · z∈[+1229,+1270]mm · 2 of 306 slices shown, 3 images]
[im 102/306  vessel]
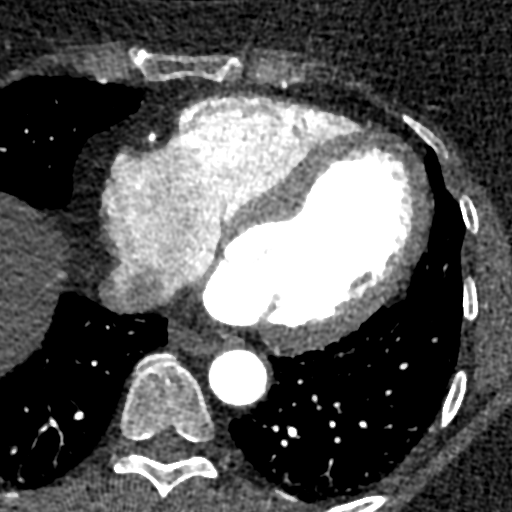
[im 102/306  lung]
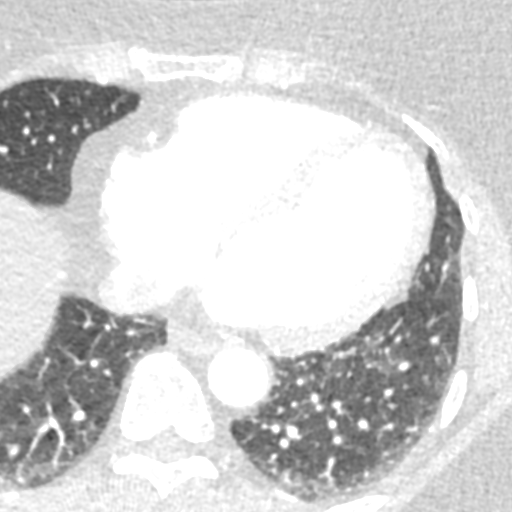
[im 204/306  vessel]
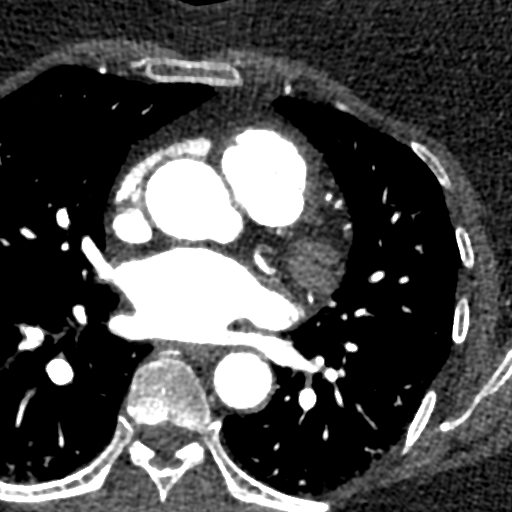

[Series 7: best syst 43 % · axial · 0.39mm/px · z∈[+1229,+1270]mm · 2 of 306 slices shown]
[im 102/306  vessel]
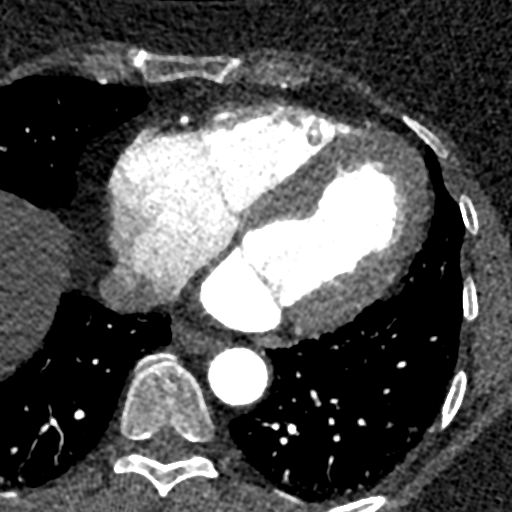
[im 204/306  vessel]
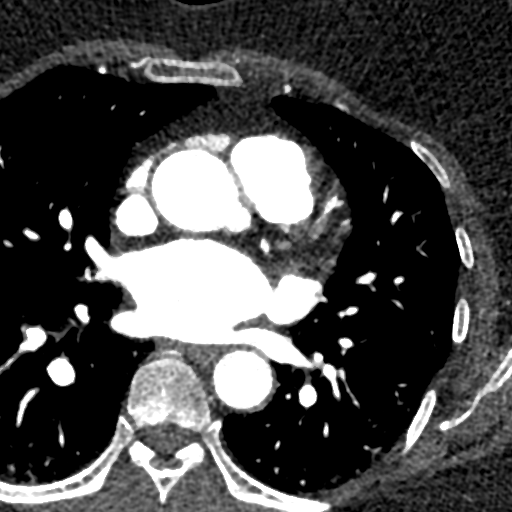

[Series 8: ts diast sharp 73 % · axial · 0.39mm/px · z∈[+1229,+1270]mm · 2 of 306 slices shown]
[im 102/306  lung]
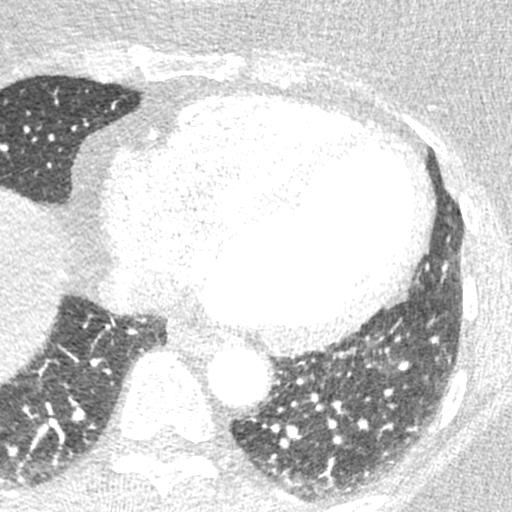
[im 204/306  lung]
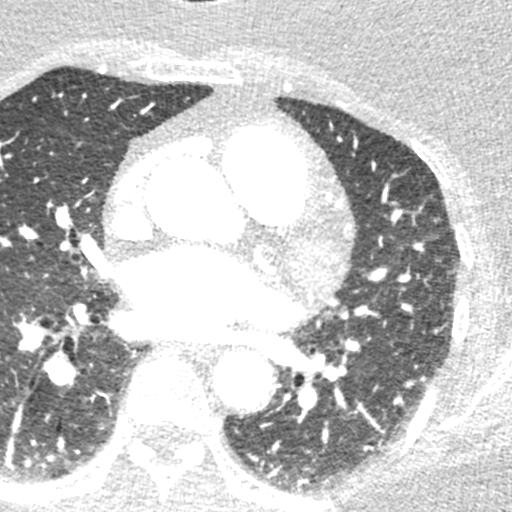

[Series 9: ts syst sharp 43 % · axial · 0.39mm/px · z∈[+1229,+1270]mm · 2 of 306 slices shown]
[im 102/306  lung]
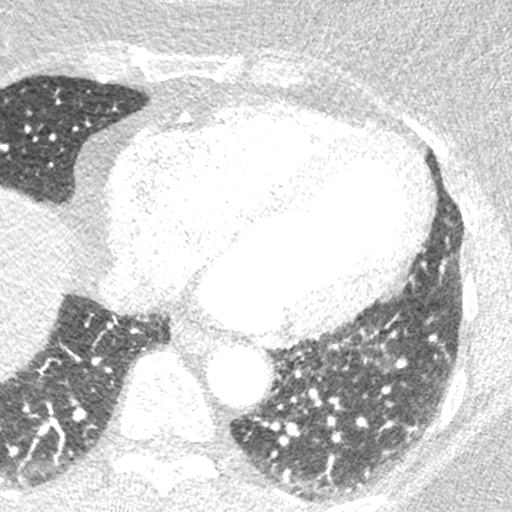
[im 204/306  lung]
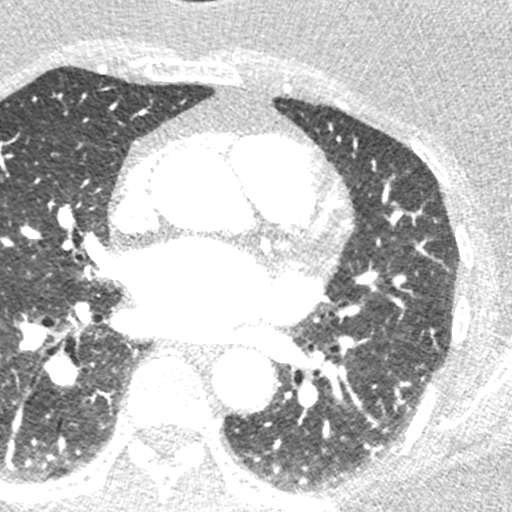

[8 of 20 positions shown; findings below may reference images not displayed]



Aorta:  Normal size.  No calcifications.  No dissection.

Aortic Valve:  Trileaflet.  No calcifications.

Coronary Arteries:  Normal coronary origin.  Right dominance.

RCA is a large dominant artery that gives rise to PDA and PLA. There
is no plaque.

Left main is a large artery that gives rise to LAD and LCX arteries.
Left main has no plaque.

LAD is a large vessel that gives rise to two diagonal arteries and
has mild calcified plaque in the proximal segment with stenosis
25-49%.

D1,2 have no significant plaque.

LCX is a non-dominant artery that gives rise to one large OM1
branch. There is no plaque.

Other findings:

Normal pulmonary vein drainage into the left atrium.

Normal left atrial appendage without a thrombus.

Normal size of the pulmonary artery.
IMPRESSION: 1. Coronary calcium score of 59. This was 85 percentile for age and
sex matched control.

2. Normal coronary origin with right dominance.

3. Mild calcified plaque in the proximal segment with stenosis
25-49%. Additional analysis with CT FFR will be submitted.

EXAM:
OVER-READ INTERPRETATION  CT CHEST

The following report is an over-read performed by radiologist Dr.
over-read does not include interpretation of cardiac or coronary
anatomy or pathology. The coronary calcium score/coronary CTA
interpretation by the cardiologist is attached.
FINDINGS: Cardiovascular: Normal heart size.  No vascular abnormality noted.

Mediastinum: No mass or adenopathy identified.

Lungs/pleura: No pleural effusion identified. No airspace opacities.
Lungs are clear.

Upper abdomen: No acute abnormality.

Musculoskeletal: No acute or suspicious osseous abnormality
identified.
IMPRESSION: 1. Negative over-read.

*** End of Addendum ***
FINDINGS: A 120 kV prospective scan was triggered in the descending thoracic
aorta at 111 HU's. Axial non-contrast 3 mm slices were carried out
through the heart. The data set was analyzed on a dedicated work
station and scored using the Agatson method. Gantry rotation speed
was 250 msecs and collimation was .6 mm. No beta blockade and 0.8 mg
of sl NTG was given. The 3D data set was reconstructed in 5%
intervals of the 67-82 % of the R-R cycle. Diastolic phases were
analyzed on a dedicated work station using MPR, MIP and VRT modes.
The patient received 80 cc of contrast.

Aorta:  Normal size.  No calcifications.  No dissection.

Aortic Valve:  Trileaflet.  No calcifications.

Coronary Arteries:  Normal coronary origin.  Right dominance.

RCA is a large dominant artery that gives rise to PDA and PLA. There
is no plaque.

Left main is a large artery that gives rise to LAD and LCX arteries.
Left main has no plaque.

LAD is a large vessel that gives rise to two diagonal arteries and
has mild calcified plaque in the proximal segment with stenosis
25-49%.

D1,2 have no significant plaque.

LCX is a non-dominant artery that gives rise to one large OM1
branch. There is no plaque.

Other findings:

Normal pulmonary vein drainage into the left atrium.

Normal left atrial appendage without a thrombus.

Normal size of the pulmonary artery.
IMPRESSION: 1. Coronary calcium score of 59. This was 85 percentile for age and
sex matched control.

2. Normal coronary origin with right dominance.

3. Mild calcified plaque in the proximal segment with stenosis
25-49%. Additional analysis with CT FFR will be submitted.

## 2020-09-09 ENCOUNTER — Telehealth: Payer: Self-pay | Admitting: Cardiology

## 2020-09-09 NOTE — Telephone Encounter (Signed)
Message left for pt that her PA is passed and she should be able to obtain her medications.

## 2020-09-09 NOTE — Telephone Encounter (Signed)
*  STAT* If patient is at the pharmacy, call can be transferred to refill team.   1. Which medications need to be refilled? (please list name of each medication and dose if known) omega-3 acid ethyl esters (LOVAZA) 1 g capsule  2. Which pharmacy/location (including street and city if local pharmacy) is medication to be sent to? [facility] - [place], [place] - [address]  3. Do they need a 30 day or 90 day supply? 90  Patient called in to say that her pharmacy needs more information on omega-3 acid ethyl esters (LOVAZA) 1 g capsule in order to refill prescription. [phone] Patient case ID [id number]. Patient stated that she is out of medication and needs refill. Please advise

## 2020-12-01 ENCOUNTER — Telehealth: Payer: Self-pay | Admitting: Cardiology

## 2020-12-01 MED ORDER — REPATHA SURECLICK 140 MG/ML ~~LOC~~ SOAJ: 140.0000 mg | SUBCUTANEOUS | 11 refills | Status: DC

## 2020-12-01 NOTE — Telephone Encounter (Signed)
Hi Lequita Halt,  Patient can go to the [employer], print out the paperwork, fill in their sections and then bring it in for the cardiologist to complete their section.  [email or web address]

## 2020-12-01 NOTE — Telephone Encounter (Signed)
Pt c/o medication issue:  1. Name of Medication: Evolocumab (REPATHA SURECLICK) 140 MG/ML SOAJ  2. How are you currently taking this medication (dosage and times per day)? As directed  3. Are you having a reaction (difficulty breathing--STAT)? no  4. What is your medication issue? Patient wanted to know if there was a financial assistance program she can enroll in for this medication. Please assist

## 2020-12-01 NOTE — Telephone Encounter (Signed)
Spoke to the patient just now and let her know the pharmacists recommendation. She states that she will look into this and will bring the papers to the office if she decides to fill them out. She states that she also needs a new refill. I sent this in for her as well.    Encouraged patient to call back with any questions or concerns.

## 2020-12-06 ENCOUNTER — Other Ambulatory Visit: Payer: Self-pay | Admitting: Cardiology

## 2020-12-07 NOTE — Telephone Encounter (Signed)
Pharmacy notified repatha to soon to refill. Last fill was on 11/2020 with refills.

## 2020-12-17 ENCOUNTER — Other Ambulatory Visit: Payer: Self-pay | Admitting: Cardiology

## 2021-01-27 ENCOUNTER — Telehealth: Payer: Self-pay

## 2021-01-27 MED ORDER — PRALUENT 150 MG/ML ~~LOC~~ SOAJ: 150.0000 mg | SUBCUTANEOUS | 11 refills | Status: DC

## 2021-01-27 NOTE — Telephone Encounter (Signed)
Please complete prior authorization for:  Name of medication, dose, and frequency Praluent 150mg  sq q 14 d  Lab Orders Requested? yes  Which labs? Lipid panel  Estimated date for labs to be scheduled: any time in next month  Does patient need activated copay card? yes

## 2021-01-27 NOTE — Telephone Encounter (Signed)
CALLED AND SPOKE TO PT AND STATED THAT THEY WERE APPROVED FOR PRALUENT, RX SENT, COPAY CARD MAILED TO PT AND THEY WERE APPROVED,

## 2021-01-27 NOTE — Telephone Encounter (Signed)
Pt called in ands stated that they can't afford repatha as they have commercial insurance and the $5 copay for 6 months has ended and they need to do something else as they cannot afford this. I will route to pharmd pool for advisement

## 2021-01-29 ENCOUNTER — Other Ambulatory Visit: Payer: Self-pay | Admitting: Neurology

## 2021-01-29 MED ORDER — EZETIMIBE 10 MG PO TABS: 10.0000 mg | ORAL_TABLET | Freq: Every day | ORAL | 5 refills | Status: DC

## 2021-02-09 ENCOUNTER — Other Ambulatory Visit: Payer: Self-pay

## 2021-02-09 MED ORDER — EZETIMIBE 10 MG PO TABS: 10.0000 mg | ORAL_TABLET | Freq: Every day | ORAL | 3 refills | Status: DC

## 2021-02-09 NOTE — Telephone Encounter (Signed)
New Rx request for Ezetimibe 10 mg tablets # 90 x 3 refills sent to [facility]

## 2021-03-02 ENCOUNTER — Telehealth: Payer: Self-pay | Admitting: Cardiology

## 2021-03-02 MED ORDER — OMEGA-3-ACID ETHYL ESTERS 1 G PO CAPS: 1.0000 | ORAL_CAPSULE | Freq: Two times a day (BID) | ORAL | 3 refills | Status: DC

## 2021-03-02 NOTE — Telephone Encounter (Signed)
Rx sent as requested.

## 2021-03-02 NOTE — Telephone Encounter (Signed)
*  STAT* If patient is at the pharmacy, call can be transferred to refill team.   1. Which medications need to be refilled? (please list name of each medication and dose if known) new prescription for Omega 3 acid Ethyl Esters  2. Which pharmacy/location (including street and city if local pharmacy) is medication to be sent to? [facility]  3. Do they need a 30 day or 90 day supply? 90 days and refills

## 2021-04-20 ENCOUNTER — Ambulatory Visit: Payer: POS | Admitting: Cardiology

## 2021-08-20 ENCOUNTER — Encounter: Payer: Self-pay | Admitting: Cardiology

## 2021-08-20 ENCOUNTER — Ambulatory Visit (INDEPENDENT_AMBULATORY_CARE_PROVIDER_SITE_OTHER): Payer: POS | Admitting: Cardiology

## 2021-08-20 VITALS — BP 120/68 | HR 81 | Ht 65.0 in | Wt 157.6 lb

## 2021-08-20 DIAGNOSIS — Z789 Other specified health status: Secondary | ICD-10-CM

## 2021-08-20 DIAGNOSIS — E782 Mixed hyperlipidemia: Secondary | ICD-10-CM

## 2021-08-20 DIAGNOSIS — I2510 Atherosclerotic heart disease of native coronary artery without angina pectoris: Secondary | ICD-10-CM

## 2021-08-20 DIAGNOSIS — R931 Abnormal findings on diagnostic imaging of heart and coronary circulation: Secondary | ICD-10-CM

## 2021-08-20 NOTE — Progress Notes (Signed)
?Cardiology Office Note:   ? ?Date:  08/20/2021  ? ?ID:  Kimberly Swanson, DOB 1961-05-12, MRN [id number] ? ?PCP:  Lowella Dandy, NP  ?Cardiologist:  Shirlee More, MD   ? ?Referring MD: Lowella Dandy, NP please do LP(a) with her next lipid profile copy to me ? ? ?ASSESSMENT:   ? ?1. Mild CAD   ?2. Elevated coronary artery calcium score   ?3. Mixed hyperlipidemia   ?4. Statin intolerance   ? ?PLAN:   ? ?In order of problems listed above: ? ?She continues to do well with her mild nonobstructive CAD she is not having anginal discomfort and is on good effective lipid-lowering treatment continue her PCSK9 Zetia.  We will ask her PCP to check LP(a) with her next labs ?Continue current lipid-lowering treatment ? ? ?Next appointment: 1 year ? ? ?Medication Adjustments/Labs and Tests Ordered: ?Current medicines are reviewed at length with the patient today.  Concerns regarding medicines are outlined above.  ?No orders of the defined types were placed in this encounter. ? ?No orders of the defined types were placed in this encounter. ? ? ?Complaint follow-up CAD ? ? ?History of Present Illness:   ? ?Kimberly Swanson is a 61 y.o. female with a hx of mild nonobstructive CAD with a calcium score of 59/85th percentile and 25 to 49% proximal LAD stenosis on CTA hyperlipidemia with statin intolerance last seen 12/31/2020. ? ?Compliance with diet, lifestyle and medications: Yes ? 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  She is not having angina has not needed nitroglycerin and tolerates her medical treatment including aspirin and combined lipid-lowering and has not required nitroglycerin. ?Past Medical History:  ?Diagnosis  Date  ? Anxiety   ? Bilateral sciatica 02/11/2015  ? Costochondritis 11/15/2018  ? DDD (degenerative disc disease), lumbar 12/30/2019  ? Depression   ? Epigastric pain 03/14/2018  ? Fatty liver 12/30/2019  ? Fibromyalgia   ? Gastro-esophageal reflux disease without esophagitis 03/14/2018  ? History of colonic polyps 03/14/2018  ? Irritable bowel syndrome with constipation 03/14/2018  ? Memory loss   ? Multiple gastric ulcers 12/30/2019  ? Formatting of this note might be different from the original. age 60  ? PONV (postoperative nausea and vomiting) 12/30/2019  ? Symptomatic menopausal or female climacteric states 05/11/2016  ? ? ?Past Surgical History:  ?Procedure Laterality Date  ? ABDOMINAL HYSTERECTOMY    ? BLADDER SURGERY    ? CHOLECYSTECTOMY    ? CYSTOCELE REPAIR    ? RECTOCELE REPAIR    ? VESICOVAGINAL FISTULA CLOSURE W/ TAH  2012  ? ? ?Current Medications: ?No outpatient medications have been marked as taking for the 08/20/21 encounter (Appointment) with Richardo Priest, MD.  ?  ? ?Allergies:   Codeine and Pravastatin  ? ?Social History  ? ?Socioeconomic History  ? Marital status: Married  ?  Spouse name: Not on file  ? Number of children: Not on file  ? Years of education: Not on file  ? Highest education level: Not on file  ?Occupational History  ? Not on file  ?Tobacco Use  ? Smoking status: Never  ? Smokeless tobacco: Never  ?  Vaping Use  ? Vaping Use: Never used  ?Substance and Sexual Activity  ? Alcohol use: Never  ?  Alcohol/week: 0.0 standard drinks  ? Drug use: Never  ? Sexual activity: Not on file  ?Other Topics Concern  ? Not on file  ?Social History Narrative  ? Not on file  ? ?Social Determinants of Health  ? ?Financial Resource Strain: Not on file  ?Food Insecurity: Not on file  ?Transportation Needs: Not on file  ?Physical Activity: Not on file  ?Stress: Not on file  ?Social Connections: Not on file  ?  ? ?Family History: ?The patient's family history includes Heart attack in her maternal grandmother;  Irregular heart beat in her mother; Lung cancer in her father. ?ROS:   ?Please see the history of present illness.    ?All other systems reviewed and are negative. ? ?EKGs/Labs/Other Studies Reviewed:   ? ?The following studies were reviewed today: ? ?EKG:  EKG ordered today and personally reviewed.  The ekg ordered today demonstrates sinus rhythm poor R wave progression otherwise normal EKG ? ?Recent Labs: ?No results found for requested labs within last 8760 hours.  ?Recent Lipid Panel ?   ?Component Value Date/Time  ? CHOL 124 03/30/2020 1046  ? TRIG 96 03/30/2020 1046  ? HDL 65 03/30/2020 1046  ? CHOLHDL 1.9 03/30/2020 1046  ? LDLCALC 41 03/30/2020 1046  ? ? ?Physical Exam:   ? ?VS:  There were no vitals taken for this visit.   ? ?Wt Readings from Last 3 Encounters:  ?01/29/20 151 lb 6.4 oz (68.7 kg)  ?01/01/20 149 lb (67.6 kg)  ?11/15/18 153 lb (69.4 kg)  ?  ? ?GEN:  Well nourished, well developed in no acute distress ?HEENT: Normal ?NECK: No JVD; No carotid bruits ?LYMPHATICS: No lymphadenopathy ?CARDIAC: RRR, no murmurs, rubs, gallops ?RESPIRATORY:  Clear to auscultation without rales, wheezing or rhonchi  ?ABDOMEN: Soft, non-tender, non-distended ?MUSCULOSKELETAL:  No edema; No deformity  ?SKIN: Warm and dry ?NEUROLOGIC:  Alert and oriented x 3 ?PSYCHIATRIC:  Normal affect  ? ? ?Signed, ?Shirlee More, MD  ?08/20/2021 4:29 PM    ?[place]  ?

## 2021-08-20 NOTE — Patient Instructions (Signed)
Medication Instructions:  ?Your physician recommends that you continue on your current medications as directed. Please refer to the Current Medication list given to you today. ? ?*If you need a refill on your cardiac medications before your next appointment, please call your pharmacy* ? ? ?Lab Work: ?NONE ?If you have labs (blood work) drawn today and your tests are completely normal, you will receive your results only by: ?MyChart Message (if you have MyChart) OR ?A paper copy in the mail ?If you have any lab test that is abnormal or we need to change your treatment, we will call you to review the results. ? ? ?Testing/Procedures: ?NONE ? ? ?Follow-Up: ?At [facility], you and your health needs are our priority.  As part of our continuing mission to provide you with exceptional heart care, we have created designated Provider Care Teams.  These Care Teams include your primary Cardiologist (physician) and Advanced Practice Providers (APPs -  Physician Assistants and Nurse Practitioners) who all work together to provide you with the care you need, when you need it. ? ?We recommend signing up for the patient portal called "MyChart".  Sign up information is provided on this After Visit Summary.  MyChart is used to connect with patients for Virtual Visits (Telemedicine).  Patients are able to view lab/test results, encounter notes, upcoming appointments, etc.  Non-urgent messages can be sent to your provider as well.   ?To learn more about what you can do with MyChart, go to [email or web address].   ? ?Your next appointment:   ?1 year(s) ? ?The format for your next appointment:   ?In Person ? ?Provider:   ?Brian Munley, MD  ? ? ?Other Instructions ?  ?

## 2022-01-09 ENCOUNTER — Other Ambulatory Visit: Payer: Self-pay | Admitting: Cardiology

## 2022-01-13 ENCOUNTER — Telehealth: Payer: Self-pay

## 2022-01-13 NOTE — Telephone Encounter (Signed)
Prior authorization started with [facility] for Praluent / Key :Key: [id number]

## 2022-01-18 NOTE — Telephone Encounter (Signed)
Approvedon August 31 CaseId:[id number];Status:Approved;Review Type:Prior Auth;Coverage Start Date:12/14/2021;Coverage End Date:01/13/2023; Drug Praluent 150MG /ML auto-injectors

## 2022-02-22 ENCOUNTER — Other Ambulatory Visit: Payer: Self-pay | Admitting: Cardiology

## 2022-03-09 ENCOUNTER — Telehealth: Payer: Self-pay

## 2022-03-09 NOTE — Telephone Encounter (Signed)
Pt calling stating that [facility] is requiring a prior auth on medication omega-3 acid ethyl esters (Lovaza), before she can get her medication. Pt would like a call back concerning this matter. Please address

## 2022-03-11 ENCOUNTER — Telehealth: Payer: Self-pay

## 2022-03-11 NOTE — Telephone Encounter (Signed)
PA for Lovaza sent with [facility] / outcome pending  Kimberly Swanson Key: [id number] - PA Case ID: [id number] Need help? Call us at [phone] Status Sent to [facility] Lovaza 1GM capsules Form [facility] Electronic PA Form [phone] NCPDP)

## 2022-03-11 NOTE — Telephone Encounter (Signed)
PA Denied, patient aware. I have requested copy of patients last year of labs from her pcp. Will send send appeal once copies of labs received. Patient voices understanding and agrees on sending appeal.    Deniedtoday CaseId:[id number];Status:Denied;Review Type:Prior Auth;Appeal Information: Attention:ATTN: [place] [id number]. [id number] CMKLK:[phone] VXY:[phone]; Important - Please read the below note on eAppeals: Please reference the denial letter for information on the rights for an appeal, rationale for the denial, and how to submit an appeal including if any information is needed to support the appeal. Note about urgent situations - Generally, an urgent situation is one which, in the opinion of the provider, the health of the patient may be in serious jeopardy or may experience pain that cannot be adequately controlled while waiting for a decision on the appeal.; Drug Lovaza 1GM capsules Form [facility] Electronic PA Form (2017 NCPDP)

## 2022-03-15 NOTE — Telephone Encounter (Signed)
Appeal started    [place] October 27 CaseId:[id number];Status:Denied;Review Type:Prior Auth;Appeal Information: Attention:ATTN: [place] [id number]. [id number] RJJOA:[phone]:[phone]; Important - Please read the below note on eAppeals: Please reference the denial letter for information on the rights for an appeal, rationale for the denial, and how to submit an appeal including if any information is needed to support the appeal. Note about urgent situations - Generally, an urgent situation is one which, in the opinion of the provider, the health of the patient may be in serious jeopardy or may experience pain that cannot be adequately controlled while waiting for a decision on the appeal.;

## 2022-03-15 NOTE — Telephone Encounter (Signed)
Received patients labs from her pcp and Dr Bettina Gavia reviewed. I have faxed an appeal to [facility]

## 2022-03-16 NOTE — Telephone Encounter (Signed)
Approval received from [facility] for Lovaza . Patient approved  through 03/17/2023 Copy of approval in chart media and patient informed as well

## 2022-07-29 ENCOUNTER — Telehealth: Payer: Self-pay | Admitting: Pharmacist Clinician (PhC)/ Clinical Pharmacy Specialist
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
## 2022-08-04 MED ORDER — REPATHA SURECLICK 140 MG/ML ~~LOC~~ SOAJ: 140.0000 mg | SUBCUTANEOUS | 0 refills | Status: DC
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
## 2022-08-04 NOTE — Addendum Note (Signed)
Addended by: Rockne Menghini on: 08/04/2022 08:54 AM   Modules accepted: Orders

## 2022-08-08 ENCOUNTER — Other Ambulatory Visit: Payer: Self-pay | Admitting: Cardiology

## 2022-08-12 ENCOUNTER — Telehealth: Payer: Self-pay | Admitting: Cardiology

## 2022-08-12 NOTE — Telephone Encounter (Signed)
*  STAT* If patient is at the pharmacy, call can be transferred to refill team.   1. Which medications need to be refilled? (please list name of each medication and dose if known)   ezetimibe (ZETIA) 10 MG tablet   2. Which pharmacy/location (including street and city if local pharmacy) is medication to be sent to?  [facility] [place], [place]   3. Do they need a 30 day or 90 day supply?   90 day   Patient states she still has some of this medication.  Patient has appointment on 5/30.

## 2022-08-29 ENCOUNTER — Telehealth: Payer: Self-pay | Admitting: Cardiology

## 2022-08-29 ENCOUNTER — Ambulatory Visit: Payer: POS | Admitting: Cardiology

## 2022-08-29 ENCOUNTER — Other Ambulatory Visit: Payer: Self-pay

## 2022-08-29 MED ORDER — EZETIMIBE 10 MG PO TABS: 10.0000 mg | ORAL_TABLET | Freq: Every day | ORAL | 3 refills | Status: DC
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
## 2022-08-29 NOTE — Telephone Encounter (Signed)
Called patient and informed her that her Ezetimide prescription had been re-filled and sent to [facility]. Patient verbalized understanding and had no further questions at this time.

## 2022-10-13 ENCOUNTER — Ambulatory Visit: Payer: POS | Admitting: Cardiology

## 2022-10-19 NOTE — Progress Notes (Unsigned)
Cardiology Office Note:    Date:  10/20/2022   ID:  [name], DOB [birth year], MRN [id number]  PCP:  Hurshel Party, NP  Cardiologist:  Norman Herrlich, MD    Referring MD: Hurshel Party, NP    ASSESSMENT:    1. Mild CAD   2. Elevated coronary artery calcium score   3. Mixed hyperlipidemia   4. Statin intolerance    PLAN:    In order of problems listed above:  Doing well from a cardiology perspective continue current treatment including combined lipid-lowering Praluent Zetia. Advised to be careful shifting posture with a tricyclic antidepressant and advised take it at bedtime as it is an effective hypnotic Switch to enteric aspirin 81 mg daily If unimproved in 2 weeks with a T CAD stop her aspirin   Next appointment: 1 year    Medication Adjustments/Labs and Tests Ordered: Current medicines are reviewed at length with the patient today.  Concerns regarding medicines are outlined above.  No orders of the defined types were placed in this encounter.  No orders of the defined types were placed in this encounter.   Chief complaint follow-up CAD   History of Present Illness:    Kimberly Swanson is a 62 y.o. female with a hx of mild nonobstructive CAD with a coronary calcium score of 59/85th percentile and mild 25 to 49% proximal LAD stenosis and CTA and hyperlipidemia with statin intolerance last seen 08/20/2021.  She had a CT of the chest performed for hemoptysis 06/29/2022 which was commented as normal liver did not show calcification. Compliance with diet, lifestyle and medications: Yes  She is troubled by esophageal symptoms has been placed on tricyclic antidepressant for pain control She has about stopping aspirin I told her to continue it for now but to switch to enteric-coated preparation and if symptoms continue she can discontinue it in a few weeks She is not having cardiovascular symptoms of edema angina shortness of breath palpitation or syncope She is on combined  lipid-lowering with Praluent and Zetia lipid profile shows an LDL of 89 cholesterol 167 triglycerides 117 HDL 57 07/10/2022 Her EKG today shows sinus rhythm and remains normal Past Medical History:  Diagnosis Date   Anxiety    Bilateral sciatica 02/11/2015   Costochondritis 11/15/2018   DDD (degenerative disc disease), lumbar 12/30/2019   Depression    Epigastric pain 03/14/2018   Fatty liver 12/30/2019   Fibromyalgia    Gastro-esophageal reflux disease without esophagitis 03/14/2018   History of colonic polyps 03/14/2018   Irritable bowel syndrome with constipation 03/14/2018   Memory loss    Multiple gastric ulcers 12/30/2019   Formatting of this note might be different from the original. age 31   PONV (postoperative nausea and vomiting) 12/30/2019   Symptomatic menopausal or female climacteric states 05/11/2016    Past Surgical History:  Procedure Laterality Date   ABDOMINAL HYSTERECTOMY     BLADDER SURGERY     CHOLECYSTECTOMY     CYSTOCELE REPAIR     RECTOCELE REPAIR     VESICOVAGINAL FISTULA CLOSURE W/ TAH  2012    Current Medications: Current Meds  Medication Sig   Alirocumab (PRALUENT) 150 MG/ML SOAJ INJECT 150 MG INTO THE SKIN EVERY 14 (FOURTEEN) DAYS.   aspirin EC 81 MG tablet Take 1 tablet (81 mg total) by mouth daily.   buPROPion (WELLBUTRIN XL) 150 MG 24 hr tablet Take 150 mg by mouth daily.    estradiol (CLIMARA - DOSED IN MG/24  HR) 0.1 mg/24hr patch Place 1 patch onto the skin once a week.   etodolac (LODINE) 400 MG tablet TAKE 1 TABLET TWICE A DAY   ezetimibe (ZETIA) 10 MG tablet Take 1 tablet (10 mg total) by mouth daily.   magnesium 30 MG tablet Take 30 mg by mouth 2 (two) times daily.   omega-3 acid ethyl esters (LOVAZA) 1 g capsule TAKE 1 CAPSULE TWICE A DAY   polyethylene glycol (MIRALAX / GLYCOLAX) packet Take 1 packet by mouth daily.   vitamin B-12 (CYANOCOBALAMIN) 100 MCG tablet Take 100 mcg by mouth daily.     Allergies:   Codeine and Pravastatin    Social History   Socioeconomic History   Marital status: Married    Spouse name: Not on file   Number of children: Not on file   Years of education: Not on file   Highest education level: Not on file  Occupational History   Not on file  Tobacco Use   Smoking status: Never    Passive exposure: Never   Smokeless tobacco: Never  Vaping Use   Vaping Use: Never used  Substance and Sexual Activity   Alcohol use: Never    Alcohol/week: 0.0 standard drinks of alcohol   Drug use: Never   Sexual activity: Not on file  Other Topics Concern   Not on file  Social History Narrative   Not on file   Social Determinants of Health   Financial Resource Strain: Not on file  Food Insecurity: Not on file  Transportation Needs: Not on file  Physical Activity: Not on file  Stress: Not on file  Social Connections: Not on file     Family History: The patient's family history includes Heart attack in her maternal grandmother; Irregular heart beat in her mother; Lung cancer in her father. ROS:   Please see the history of present illness.    All other systems reviewed and are negative.  EKGs/Labs/Other Studies Reviewed:    The following studies were reviewed today:  Cardiac Studies & Procedures          CT SCANS  CT CORONARY MORPH W/CTA COR W/SCORE 11/06/2018  Addendum 11/06/2018  1:31 PM ADDENDUM REPORT: 11/06/2018 13:29  EXAM: OVER-READ INTERPRETATION  CT CHEST  The following report is an over-read performed by radiologist Dr. Schuyler Amor [facility] on 11/06/2018. This over-read does not include interpretation of cardiac or coronary anatomy or pathology. The coronary calcium score/coronary CTA interpretation by the cardiologist is attached.  COMPARISON:  None.  FINDINGS: Cardiovascular: Normal heart size.  No vascular abnormality noted.  Mediastinum: No mass or adenopathy identified.  Lungs/pleura: No pleural effusion identified. No airspace  opacities. Lungs are clear.  Upper abdomen: No acute abnormality.  Musculoskeletal: No acute or suspicious osseous abnormality identified.  IMPRESSION: 1. Negative over-read.   Electronically Signed By: Signa Kell M.D. On: 11/06/2018 13:29  Narrative CLINICAL DATA:  62 year old female with atypical chest pain.  EXAM: Cardiac/Coronary  CT  TECHNIQUE: The patient was scanned on a [employer].  FINDINGS: A 120 kV prospective scan was triggered in the descending thoracic aorta at 111 HU's. Axial non-contrast 3 mm slices were carried out through the heart. The data set was analyzed on a dedicated work station and scored using the Agatson method. Gantry rotation speed was 250 msecs and collimation was .6 mm. No beta blockade and 0.8 mg of sl NTG was given. The 3D data set was reconstructed in 5% intervals of the  67-82 % of the R-R cycle. Diastolic phases were analyzed on a dedicated work station using MPR, MIP and VRT modes. The patient received 80 cc of contrast.  Aorta:  Normal size.  No calcifications.  No dissection.  Aortic Valve:  Trileaflet.  No calcifications.  Coronary Arteries:  Normal coronary origin.  Right dominance.  RCA is a large dominant artery that gives rise to PDA and PLA. There is no plaque.  Left main is a large artery that gives rise to LAD and LCX arteries. Left main has no plaque.  LAD is a large vessel that gives rise to two diagonal arteries and has mild calcified plaque in the proximal segment with stenosis 25-49%.  D1,2 have no significant plaque.  LCX is a non-dominant artery that gives rise to one large OM1 branch. There is no plaque.  Other findings:  Normal pulmonary vein drainage into the left atrium.  Normal left atrial appendage without a thrombus.  Normal size of the pulmonary artery.  IMPRESSION: 1. Coronary calcium score of 59. This was 36 percentile for age and sex matched control.  2. Normal  coronary origin with right dominance.  3. Mild calcified plaque in the proximal segment with stenosis 25-49%. Additional analysis with CT FFR will be submitted.  Electronically Signed: By: Tobias Alexander On: 11/06/2018 12:25           Physical Exam:    VS:  BP 112/68 (BP Location: Right Arm, Patient Position: Sitting, Cuff Size: Normal)   Pulse 67   Ht 5\' 5"  (1.651 m)   Wt 149 lb 3.2 oz (67.7 kg)   SpO2 97%   BMI 24.83 kg/m     Wt Readings from Last 3 Encounters:  10/20/22 149 lb 3.2 oz (67.7 kg)  08/20/21 157 lb 9.6 oz (71.5 kg)  01/29/20 151 lb 6.4 oz (68.7 kg)     GEN:  Well nourished, well developed in no acute distress HEENT: Normal NECK: No JVD; No carotid bruits LYMPHATICS: No lymphadenopathy CARDIAC: RRR, no murmurs, rubs, gallops RESPIRATORY:  Clear to auscultation without rales, wheezing or rhonchi  ABDOMEN: Soft, non-tender, non-distended MUSCULOSKELETAL:  No edema; No deformity  SKIN: Warm and dry NEUROLOGIC:  Alert and oriented x 3 PSYCHIATRIC:  Normal affect    Signed, Norman Herrlich, MD  10/20/2022 3:53 PM    [facility]

## 2022-10-20 ENCOUNTER — Ambulatory Visit: Payer: Commercial Managed Care - PPO | Attending: Cardiology | Admitting: Cardiology

## 2022-10-20 ENCOUNTER — Encounter: Payer: Self-pay | Admitting: Cardiology

## 2022-10-20 VITALS — BP 112/68 | HR 67 | Ht 65.0 in | Wt 149.2 lb

## 2022-10-20 DIAGNOSIS — I2510 Atherosclerotic heart disease of native coronary artery without angina pectoris: Secondary | ICD-10-CM | POA: Diagnosis not present

## 2022-10-20 DIAGNOSIS — R931 Abnormal findings on diagnostic imaging of heart and coronary circulation: Secondary | ICD-10-CM

## 2022-10-20 DIAGNOSIS — Z789 Other specified health status: Secondary | ICD-10-CM | POA: Diagnosis not present

## 2022-10-20 DIAGNOSIS — E782 Mixed hyperlipidemia: Secondary | ICD-10-CM

## 2022-10-20 NOTE — Patient Instructions (Addendum)
Medication Instructions:   START: Enteric Coated Aspirin 81mg  1 tablet daily   Lab Work: None Ordered If you have labs (blood work) drawn today and your tests are completely normal, you will receive your results only by: MyChart Message (if you have MyChart) OR A paper copy in the mail If you have any lab test that is abnormal or we need to change your treatment, we will call you to review the results.   Testing/Procedures: None Ordered   Follow-Up: At [facility], you and your health needs are our priority.  As part of our continuing mission to provide you with exceptional heart care, we have created designated Provider Care Teams.  These Care Teams include your primary Cardiologist (physician) and Advanced Practice Providers (APPs -  Physician Assistants and Nurse Practitioners) who all work together to provide you with the care you need, when you need it.  We recommend signing up for the patient portal called "MyChart".  Sign up information is provided on this After Visit Summary.  MyChart is used to connect with patients for Virtual Visits (Telemedicine).  Patients are able to view lab/test results, encounter notes, upcoming appointments, etc.  Non-urgent messages can be sent to your provider as well.   To learn more about what you can do with MyChart, go to [email or web address].    Your next appointment:   12 month(s)  The format for your next appointment:   In Person  Provider:   Norman Herrlich, MD    Other Instructions NA

## 2022-10-21 NOTE — Addendum Note (Signed)
Addended by: Maeven Mcdougall, Elmarie Shiley L on: 10/21/2022 07:18 AM   Modules accepted: Orders

## 2023-01-02 ENCOUNTER — Telehealth: Payer: Self-pay

## 2023-01-02 NOTE — Telephone Encounter (Signed)
Received fax stating patient's prior authorization for Praluent is expiring soon.  I reviewed the chart and it appears to expire 01/13/23.  I will route to prior auth team to renew the patient's PA for her Praluent. Key from [facility]: [id number]

## 2023-01-03 ENCOUNTER — Other Ambulatory Visit (HOSPITAL_COMMUNITY): Payer: Self-pay

## 2023-01-03 ENCOUNTER — Telehealth: Payer: Self-pay | Admitting: Pharmacy Technician

## 2023-01-03 NOTE — Telephone Encounter (Signed)
Pharmacy Patient Advocate Encounter   Received notification from CoverMyMeds that prior authorization for Praluent 150MG /ML auto-injectors is required/requested.   Insurance verification completed.   The patient is insured through [employer] .   Per test claim: PA required; PA submitted to [employer] via CoverMyMeds Key/confirmation #/EOC [id number] Status is pending

## 2023-01-03 NOTE — Telephone Encounter (Signed)
Faxed additional information to [phone]

## 2023-01-05 ENCOUNTER — Other Ambulatory Visit (HOSPITAL_COMMUNITY): Payer: Self-pay

## 2023-01-05 NOTE — Telephone Encounter (Signed)
Routed to R. Cox, RN (Dr. Dulce Sellar)

## 2023-01-05 NOTE — Telephone Encounter (Signed)
Pharmacy Patient Advocate Encounter  Received notification from [employer] that Prior Authorization for Praluent 150MG /ml has been APPROVED from 01/03/23 to 01/04/24. Ran test claim, Copay is $214.02. This test claim was processed through [facility]- copay amounts may vary at other pharmacies due to pharmacy/plan contracts, or as the patient moves through the different stages of their insurance plan.   PA #/Case ID/Reference #: [id number]

## 2023-01-09 ENCOUNTER — Other Ambulatory Visit: Payer: Self-pay | Admitting: Cardiology

## 2023-01-09 ENCOUNTER — Other Ambulatory Visit: Payer: Self-pay | Admitting: *Deleted

## 2023-01-09 MED ORDER — PRALUENT 150 MG/ML ~~LOC~~ SOAJ: 150.0000 mg | SUBCUTANEOUS | 11 refills | Status: DC

## 2023-01-11 NOTE — Telephone Encounter (Signed)
Called patient and informed her that her prior authorization had been approved and she was able to go to the pharmacy and pick - up her medication. Patient stated that she had picked up her medication yesterday at her pharmacy. Patient was appreciative for the call and had no further questions at this time.

## 2023-02-24 ENCOUNTER — Other Ambulatory Visit: Payer: Self-pay | Admitting: Cardiology

## 2023-05-07 NOTE — Progress Notes (Unsigned)
Cardiology Office Note:  .   Date:  05/08/2023  ID:  Kimberly Swanson, DOB [birth year], MRN [id number] PCP: Hurshel Party, NP  [facility] Providers Cardiologist:  Norman Herrlich, MD    History of Present Illness: .   Kimberly Swanson is a 62 y.o. female with a past medical history of nonobstructive CAD, dyslipidemia with statin intolerance, IBS, anxiety.  11/06/2018 coronary CTA calcium score 59, 85th percentile, mild calcified plaque in the proximal segment of the LAD, FFR was negative.  Evaluated by Dr. Dulce Sellar on 10/20/2022, she was doing well from a cardiac perspective, tolerating Praluent and Zetia.  She was having some issues with GERD, there was discussion about stopping her aspirin if she could not tolerate it.  Admitted to [facility] on 04/22/2023 for chest pain that have been occurring intermittently for a few days.  Troponin 3>> 3, EKG revealed no changes, chest x-ray was negative.  She was discharged the following day, advised to follow-up with cardiology.  She presents today for follow-up after recent admission as outlined above.  She recounts the days leading up to her admission as having chest pain for a few days, predominantly intermittently but would last for up to 3 hours.  She describes the sensation as pressure as well as pain, radiating to her left arm.  She has not had any further episodes. She denies chest pain, palpitations, dyspnea, pnd, orthopnea, n, v, dizziness, syncope, edema, weight gain, or early satiety.   ROS: Review of Systems  Cardiovascular:  Positive for chest pain.  All other systems reviewed and are negative.    Studies Reviewed: .        Cardiac Studies & Procedures         CT SCANS  CT CORONARY FRACTIONAL FLOW RESERVE DATA PREP 11/07/2018  Narrative EXAM: CT FFR ANALYSIS  CLINICAL DATA:  62 year old female with chest pain.  FINDINGS: FFRct analysis was performed on the original cardiac CT angiogram dataset.  Diagrammatic representation of the FFRct analysis is provided in a separate PDF document in PACS. This dictation was created using the PDF document and an interactive 3D model of the results. 3D model is not available in the EMR/PACS. Normal FFR range is >0.80.  1. Left Main:  No significant stenosis.  2. LAD: No significant stenosis. 3. LCX: No significant stenosis. 4. RCA: No significant stenosis.  IMPRESSION: 1.  CT FFR analysis didn't show any significant stenosis.   Electronically Signed By: Tobias Alexander On: 11/07/2018 08:20   CT CORONARY MORPH W/CTA COR W/SCORE 11/06/2018  Addendum 11/06/2018  1:31 PM ADDENDUM REPORT: 11/06/2018 13:29  EXAM: OVER-READ INTERPRETATION  CT CHEST  The following report is an over-read performed by radiologist Dr. Schuyler Amor [facility] on 11/06/2018. This over-read does not include interpretation of cardiac or coronary anatomy or pathology. The coronary calcium score/coronary CTA interpretation by the cardiologist is attached.  COMPARISON:  None.  FINDINGS: Cardiovascular: Normal heart size.  No vascular abnormality noted.  Mediastinum: No mass or adenopathy identified.  Lungs/pleura: No pleural effusion identified. No airspace opacities. Lungs are clear.  Upper abdomen: No acute abnormality.  Musculoskeletal: No acute or suspicious osseous abnormality identified.  IMPRESSION: 1. Negative over-read.   Electronically Signed By: Signa Kell M.D. On: 11/06/2018 13:29  Narrative CLINICAL DATA:  62 year old female with atypical chest pain.  EXAM: Cardiac/Coronary  CT  TECHNIQUE: The patient was scanned on a [employer].  FINDINGS: A 120  kV prospective scan was triggered in the descending thoracic aorta at 111 HU's. Axial non-contrast 3 mm slices were carried out through the heart. The data set was analyzed on a dedicated work station and scored using the Agatson method. Gantry  rotation speed was 250 msecs and collimation was .6 mm. No beta blockade and 0.8 mg of sl NTG was given. The 3D data set was reconstructed in 5% intervals of the 67-82 % of the R-R cycle. Diastolic phases were analyzed on a dedicated work station using MPR, MIP and VRT modes. The patient received 80 cc of contrast.  Aorta:  Normal size.  No calcifications.  No dissection.  Aortic Valve:  Trileaflet.  No calcifications.  Coronary Arteries:  Normal coronary origin.  Right dominance.  RCA is a large dominant artery that gives rise to PDA and PLA. There is no plaque.  Left main is a large artery that gives rise to LAD and LCX arteries. Left main has no plaque.  LAD is a large vessel that gives rise to two diagonal arteries and has mild calcified plaque in the proximal segment with stenosis 25-49%.  D1,2 have no significant plaque.  LCX is a non-dominant artery that gives rise to one large OM1 branch. There is no plaque.  Other findings:  Normal pulmonary vein drainage into the left atrium.  Normal left atrial appendage without a thrombus.  Normal size of the pulmonary artery.  IMPRESSION: 1. Coronary calcium score of 59. This was 38 percentile for age and sex matched control.  2. Normal coronary origin with right dominance.  3. Mild calcified plaque in the proximal segment with stenosis 25-49%. Additional analysis with CT FFR will be submitted.  Electronically Signed: By: Tobias Alexander On: 11/06/2018 12:25          Risk Assessment/Calculations:             Physical Exam:   VS:  BP 122/62   Pulse 67   Ht 5\' 5"  (1.651 m)   Wt 158 lb 12.8 oz (72 kg)   SpO2 96%   BMI 26.43 kg/m    Wt Readings from Last 3 Encounters:  05/08/23 158 lb 12.8 oz (72 kg)  10/20/22 149 lb 3.2 oz (67.7 kg)  08/20/21 157 lb 9.6 oz (71.5 kg)    GEN: Well nourished, well developed in no acute distress NECK: No JVD; No carotid bruits CARDIAC: RRR, no murmurs, rubs,  gallops RESPIRATORY:  Clear to auscultation without rales, wheezing or rhonchi  ABDOMEN: Soft, non-tender, non-distended EXTREMITIES:  No edema; No deformity   ASSESSMENT AND PLAN: .   CAD-mild nonobstructive per coronary CTA in 2020.  Recent evaluation as outlined above in the HPI for chest pain, troponins were negative, EKG was without changes.  There were plans to admit her and perform a stress evaluation however she was subsequently discharged before this occurred.  We did discuss we could arrange a stress evaluation in the office or repeat coronary CTA however she would like to wait till after the new year.  Her chest pain had mixed features, the fact that her troponins were negative and her EKG was normal is reassuring.  I will send her in a prescription for nitroglycerin as needed.  We discussed how to use it and when she should present to the emergency department.  Continue aspirin 81 mg daily.  Dyslipidemia with statin intolerance-currently on Praluent, she had discontinued her Zetia as she did not feel like she needed it however recent  lab work revealed her LDL had crept back up to 84, we will restart her on Zetia.  Recent LFTs were normal.  She says she has a follow-up appointment with her PCP in February for fasting blood work, will send our office note to her PCP office so they are aware we need her lipids checked.  Restart Zetia 10 mg daily, continue Praluent, continue Lovaza.         Dispo: Restart Zetia 10 mg daily, nitroglycerin as needed for chest pain.  Keep follow-up appointment with Dr. Dulce Sellar.  Signed, Flossie Dibble, NP

## 2023-05-08 ENCOUNTER — Encounter: Payer: Self-pay | Admitting: Cardiology

## 2023-05-08 ENCOUNTER — Other Ambulatory Visit: Payer: Self-pay

## 2023-05-08 ENCOUNTER — Ambulatory Visit: Payer: 59 | Attending: Cardiology | Admitting: Cardiology

## 2023-05-08 VITALS — BP 122/62 | HR 67 | Ht 65.0 in | Wt 158.8 lb

## 2023-05-08 DIAGNOSIS — E782 Mixed hyperlipidemia: Secondary | ICD-10-CM

## 2023-05-08 DIAGNOSIS — Z789 Other specified health status: Secondary | ICD-10-CM | POA: Diagnosis not present

## 2023-05-08 DIAGNOSIS — I2510 Atherosclerotic heart disease of native coronary artery without angina pectoris: Secondary | ICD-10-CM | POA: Diagnosis not present

## 2023-05-08 MED ORDER — EZETIMIBE 10 MG PO TABS: 10.0000 mg | ORAL_TABLET | Freq: Every day | ORAL | 3 refills | Status: DC

## 2023-05-08 MED ORDER — NITROGLYCERIN 0.4 MG SL SUBL: 0.4000 mg | SUBLINGUAL_TABLET | SUBLINGUAL | 2 refills | Status: AC | PRN

## 2023-05-08 NOTE — Patient Instructions (Signed)
Medication Instructions:  Your physician has recommended you make the following change in your medication:   START: Nitroglycerin 0.4 mg under the tongue every 5 minutes x 3 as needed for chest pain. START: Zetia 10 mg daily  *If you need a refill on your cardiac medications before your next appointment, please call your pharmacy*   Lab Work: None If you have labs (blood work) drawn today and your tests are completely normal, you will receive your results only by: MyChart Message (if you have MyChart) OR A paper copy in the mail If you have any lab test that is abnormal or we need to change your treatment, we will call you to review the results.   Testing/Procedures: None   Follow-Up: At [facility], you and your health needs are our priority.  As part of our continuing mission to provide you with exceptional heart care, we have created designated Provider Care Teams.  These Care Teams include your primary Cardiologist (physician) and Advanced Practice Providers (APPs -  Physician Assistants and Nurse Practitioners) who all work together to provide you with the care you need, when you need it.  We recommend signing up for the patient portal called "MyChart".  Sign up information is provided on this After Visit Summary.  MyChart is used to connect with patients for Virtual Visits (Telemedicine).  Patients are able to view lab/test results, encounter notes, upcoming appointments, etc.  Non-urgent messages can be sent to your provider as well.   To learn more about what you can do with MyChart, go to [email or web address].    Your next appointment:   Keep follow up appointment with Dr. Dulce Sellar in June.  Provider:   Norman Herrlich, MD    Other Instructions None

## 2023-05-25 ENCOUNTER — Other Ambulatory Visit (HOSPITAL_COMMUNITY): Payer: Self-pay

## 2023-05-25 ENCOUNTER — Telehealth: Payer: Self-pay | Admitting: Pharmacy Technician

## 2023-05-25 ENCOUNTER — Telehealth: Payer: Self-pay

## 2023-05-25 NOTE — Telephone Encounter (Signed)
 Pharmacy Patient Advocate Encounter   Received notification from [employer] that prior authorization for lovaza  is required/requested.   Insurance verification completed.   The patient is insured through [employer] .   Per test claim: PA required; PA submitted to above mentioned insurance via Fax Key/confirmation #/EOC faxed Status is pending

## 2023-05-25 NOTE — Telephone Encounter (Signed)
 See Pharmacy Prior Auth telephone encounter from 05/25/2023 per Recardo Evangelist

## 2023-05-30 ENCOUNTER — Other Ambulatory Visit (HOSPITAL_COMMUNITY): Payer: Self-pay

## 2023-05-30 NOTE — Telephone Encounter (Signed)
 Pharmacy Patient Advocate Encounter  Received notification from [employer] that Prior Authorization for lovazahas been APPROVED from 05/17/23 to 05/25/24   PA #/Case ID/Reference #: [id number]

## 2023-08-23 ENCOUNTER — Other Ambulatory Visit: Payer: Self-pay | Admitting: Cardiology

## 2023-10-16 ENCOUNTER — Other Ambulatory Visit: Payer: Self-pay

## 2023-10-16 MED ORDER — PRALUENT 150 MG/ML ~~LOC~~ SOAJ: 150.0000 mg | SUBCUTANEOUS | 1 refills | Status: DC

## 2023-11-08 ENCOUNTER — Telehealth: Payer: Self-pay | Admitting: Pharmacy Technician

## 2023-11-08 ENCOUNTER — Telehealth: Payer: Self-pay

## 2023-11-08 ENCOUNTER — Other Ambulatory Visit (HOSPITAL_COMMUNITY): Payer: Self-pay

## 2023-11-08 NOTE — Telephone Encounter (Signed)
 Pharmacy Patient Advocate Encounter   Received notification from Pt Calls Messages-tiffany that prior authorization for praluent  150mg  is required/requested.   Insurance verification completed.   The patient is insured through [facility] .   Per test claim: PA required; PA submitted to above mentioned insurance via CoverMyMeds Key/confirmation #/EOC [id number] Status is pending

## 2023-11-08 NOTE — Telephone Encounter (Signed)
 Fax received from [facility] in [place] needing a prior authorization completed for patient's Praluent .  I will route to the prior auth team for completion.

## 2023-11-09 NOTE — Telephone Encounter (Addendum)
 Pharmacy Patient Advocate Encounter  Received notification from [facility] that Prior Authorization for praluent  150mg  has been APPROVED from 11/08/23 to 05/09/24. Spoke to pharmacy to process.Copay is $45.00.    PA #/Case ID/Reference #: [id number]

## 2023-11-09 NOTE — Progress Notes (Signed)
 Cardiology Office Note:  .   Date:  11/10/2023  ID:  Kimberly Swanson, DOB [birth year], MRN [id number] PCP: Erick Greig LABOR, NP  [facility] Providers Cardiologist:  Redell Leiter, MD    History of Present Illness: .   Kimberly Swanson is a 63 y.o. female with a past medical history of nonobstructive CAD, dyslipidemia with statin intolerance, IBS, anxiety.  11/06/2018 coronary CTA calcium score 59, 85th percentile, mild calcified plaque in the proximal segment of the LAD, FFR was negative.  She initially established care with Dr. Leiter in 2020 for the evaluation of atypical chest pain, felt to be consistent with costochondritis.  She underwent a coronary CTA the same year revealing nonobstructive CAD.  Evaluated by Dr. Leiter on 10/20/2022, she was doing well from a cardiac perspective, tolerating Praluent  and Zetia .  She was having some issues with GERD, there was discussion about stopping her aspirin  if she could not tolerate it. Admitted to [facility] on 04/22/2023 for chest pain that have been occurring intermittently for a few days.  Troponin 3>> 3, EKG revealed no changes, chest x-ray was negative.  She was discharged the following day, advised to follow-up with cardiology.  Most recently she was evaluated by myself following her ED visit for chest pain on 05/08/2023, she had self discontinued her Zetia  however her recent lipid panel revealed her cholesterol was elevated again we made plans to restart this and her PCP was going to recheck her fasting blood work.  She presents today for follow up of her CAD. She has been doing well since last evaluated in our office, no formal complaints from a cardiac perspective. Follows with her PCP every three months for lab work. She questions if she needs a repeat cCTA, however she does not have any symptoms consistent with angina. She denies chest pain, palpitations, dyspnea, pnd, orthopnea, n, v, dizziness, syncope, edema,  weight gain, or early satiety.   ROS: Review of Systems  All other systems reviewed and are negative.    Studies Reviewed: SABRA       EKG Interpretation Date/Time:  Friday November 10 2023 10:36:37 EDT Ventricular Rate:  70 PR Interval:  148 QRS Duration:  90 QT Interval:  406 QTC Calculation: 438 R Axis:   -11  Text Interpretation: Normal sinus rhythm Minimal voltage criteria for LVH, may be normal variant ( R in aVL ) Cannot rule out Anterior infarct , age undetermined No previous ECGs available Confirmed by Carlin Nest [phone]) on 11/10/2023 1:16:20 PM   Cardiac Studies & Procedures   ______________________________________________________________________________________________          CT SCANS  CT CORONARY FRACTIONAL FLOW RESERVE DATA PREP 11/07/2018  Narrative EXAM: CT FFR ANALYSIS  CLINICAL DATA:  63 year old female with chest pain.  FINDINGS: FFRct analysis was performed on the original cardiac CT angiogram dataset. Diagrammatic representation of the FFRct analysis is provided in a separate PDF document in PACS. This dictation was created using the PDF document and an interactive 3D model of the results. 3D model is not available in the EMR/PACS. Normal FFR range is >0.80.  1. Left Main:  No significant stenosis.  2. LAD: No significant stenosis. 3. LCX: No significant stenosis. 4. RCA: No significant stenosis.  IMPRESSION: 1.  CT FFR analysis didn't show any significant stenosis.   Electronically Signed By: Leim Moose On: 11/07/2018 08:20   CT CORONARY MORPH W/CTA COR W/SCORE 11/06/2018  Addendum 11/06/2018  1:31 PM  ADDENDUM REPORT: 11/06/2018 13:29  EXAM: OVER-READ INTERPRETATION  CT CHEST  The following report is an over-read performed by radiologist Dr. Waddell Cola [facility] on 11/06/2018. This over-read does not include interpretation of cardiac or coronary anatomy or pathology. The coronary calcium score/coronary  CTA interpretation by the cardiologist is attached.  COMPARISON:  None.  FINDINGS: Cardiovascular: Normal heart size.  No vascular abnormality noted.  Mediastinum: No mass or adenopathy identified.  Lungs/pleura: No pleural effusion identified. No airspace opacities. Lungs are clear.  Upper abdomen: No acute abnormality.  Musculoskeletal: No acute or suspicious osseous abnormality identified.  IMPRESSION: 1. Negative over-read.   Electronically Signed By: Waddell Calk M.D. On: 11/06/2018 13:29  Narrative CLINICAL DATA:  63 year old female with atypical chest pain.  EXAM: Cardiac/Coronary  CT  TECHNIQUE: The patient was scanned on a [employer].  FINDINGS: A 120 kV prospective scan was triggered in the descending thoracic aorta at 111 HU's. Axial non-contrast 3 mm slices were carried out through the heart. The data set was analyzed on a dedicated work station and scored using the Agatson method. Gantry rotation speed was 250 msecs and collimation was .6 mm. No beta blockade and 0.8 mg of sl NTG was given. The 3D data set was reconstructed in 5% intervals of the 67-82 % of the R-R cycle. Diastolic phases were analyzed on a dedicated work station using MPR, MIP and VRT modes. The patient received 80 cc of contrast.  Aorta:  Normal size.  No calcifications.  No dissection.  Aortic Valve:  Trileaflet.  No calcifications.  Coronary Arteries:  Normal coronary origin.  Right dominance.  RCA is a large dominant artery that gives rise to PDA and PLA. There is no plaque.  Left main is a large artery that gives rise to LAD and LCX arteries. Left main has no plaque.  LAD is a large vessel that gives rise to two diagonal arteries and has mild calcified plaque in the proximal segment with stenosis 25-49%.  D1,2 have no significant plaque.  LCX is a non-dominant artery that gives rise to one large OM1 branch. There is no plaque.  Other  findings:  Normal pulmonary vein drainage into the left atrium.  Normal left atrial appendage without a thrombus.  Normal size of the pulmonary artery.  IMPRESSION: 1. Coronary calcium score of 59. This was 27 percentile for age and sex matched control.  2. Normal coronary origin with right dominance.  3. Mild calcified plaque in the proximal segment with stenosis 25-49%. Additional analysis with CT FFR will be submitted.  Electronically Signed: By: Leim Moose On: 11/06/2018 12:25     ______________________________________________________________________________________________      Risk Assessment/Calculations:             Physical Exam:   VS:  BP 110/80   Pulse 68   Ht 5' 5 (1.651 m)   Wt 159 lb (72.1 kg)   SpO2 97%   BMI 26.46 kg/m    Wt Readings from Last 3 Encounters:  11/10/23 159 lb (72.1 kg)  05/08/23 158 lb 12.8 oz (72 kg)  10/20/22 149 lb 3.2 oz (67.7 kg)    GEN: Well nourished, well developed in no acute distress NECK: No JVD; No carotid bruits CARDIAC: RRR, no murmurs, rubs, gallops RESPIRATORY:  Clear to auscultation without rales, wheezing or rhonchi  ABDOMEN: Soft, non-tender, non-distended EXTREMITIES:  No edema; No deformity   ASSESSMENT AND PLAN: .   CAD-mild nonobstructive per coronary CTA  in 2020. Stable with no anginal symptoms. No indication for ischemic evaluation.  Continue aspirin  81 mg daily, Praluent , Zetia , nitroglycerin  as needed, Lovaza .  Dyslipidemia with statin intolerance-currently on Praluent , Zetia , Lovaza , formally monitored by her PCP.         Dispo:  Follow up in 1 year.   Signed, Delon JAYSON Hoover, NP

## 2023-11-10 ENCOUNTER — Ambulatory Visit: Attending: Cardiology | Admitting: Cardiology

## 2023-11-10 ENCOUNTER — Encounter: Payer: Self-pay | Admitting: Cardiology

## 2023-11-10 VITALS — BP 110/80 | HR 68 | Ht 65.0 in | Wt 159.0 lb

## 2023-11-10 DIAGNOSIS — E782 Mixed hyperlipidemia: Secondary | ICD-10-CM

## 2023-11-10 DIAGNOSIS — Z789 Other specified health status: Secondary | ICD-10-CM

## 2023-11-10 DIAGNOSIS — R2689 Other abnormalities of gait and mobility: Secondary | ICD-10-CM | POA: Insufficient documentation

## 2023-11-10 DIAGNOSIS — R519 Headache, unspecified: Secondary | ICD-10-CM | POA: Insufficient documentation

## 2023-11-10 DIAGNOSIS — I2510 Atherosclerotic heart disease of native coronary artery without angina pectoris: Secondary | ICD-10-CM | POA: Diagnosis not present

## 2023-11-10 NOTE — Patient Instructions (Signed)

## 2023-11-18 ENCOUNTER — Encounter (HOSPITAL_BASED_OUTPATIENT_CLINIC_OR_DEPARTMENT_OTHER): Payer: Self-pay

## 2023-11-18 ENCOUNTER — Ambulatory Visit (HOSPITAL_BASED_OUTPATIENT_CLINIC_OR_DEPARTMENT_OTHER)
Admission: EM | Admit: 2023-11-18 | Discharge: 2023-11-18 | Disposition: A | Discharge status: Home / Self Care | Attending: Family Medicine | Admitting: Family Medicine

## 2023-11-18 DIAGNOSIS — N3000 Acute cystitis without hematuria: Secondary | ICD-10-CM | POA: Insufficient documentation

## 2023-11-18 LAB — POCT URINALYSIS DIP (MANUAL ENTRY)
Glucose, UA: 100 mg/dL — AB
Ketones, POC UA: NEGATIVE mg/dL
Nitrite, UA: POSITIVE — AB
Protein Ur, POC: 100 mg/dL — AB
Spec Grav, UA: 1.01 (ref 1.010–1.025)
Urobilinogen, UA: 2 U/dL — AB
pH, UA: 5.5 (ref 5.0–8.0)

## 2023-11-18 MED ORDER — NITROFURANTOIN MONOHYD MACRO 100 MG PO CAPS: 100.0000 mg | ORAL_CAPSULE | Freq: Two times a day (BID) | ORAL | 0 refills | Status: DC

## 2023-11-18 NOTE — Discharge Instructions (Signed)
 Treating you for a UTI. Take the antibiotics as prescribed.  You could continue the Azo as needed.  Make sure you are drinking plenty of fluids and follow-up as needed

## 2023-11-18 NOTE — ED Provider Notes (Signed)
 [facility]    CSN: [id number] Arrival date & time: 11/18/23  0847      History   Chief Complaint Chief Complaint  Patient presents with   Dysuria    HPI Kimberly Swanson is a 63 y.o. female.    Dysuria Pain quality:  Stabbing and burning Pain severity:  Severe Onset quality:  Gradual Duration:  3 days Timing:  Constant Progression:  Unchanged Chronicity:  New Recent urinary tract infections: no   Relieved by: AZO helps. Worsened by:  Nothing Ineffective treatments:  Phenazopyridine Associated symptoms: no abdominal pain, no fever, no flank pain, no nausea, no vaginal discharge and no vomiting   Risk factors: recurrent urinary tract infections     Past Medical History:  Diagnosis Date   Anxiety    Bilateral sciatica 02/11/2015   Costochondritis 11/15/2018   DDD (degenerative disc disease), lumbar 12/30/2019   Depression    Epigastric pain 03/14/2018   Fatty liver 12/30/2019   Fibromyalgia    Gastro-esophageal reflux disease without esophagitis 03/14/2018   History of colonic polyps 03/14/2018   Irritable bowel syndrome with constipation 03/14/2018   Memory loss    Multiple gastric ulcers 12/30/2019   Formatting of this note might be different from the original. age 51   PONV (postoperative nausea and vomiting) 12/30/2019   Symptomatic menopausal or female climacteric states 05/11/2016    Patient Active Problem List   Diagnosis Date Noted   Frequent headaches 11/10/2023   Impairment of balance 11/10/2023   DDD (degenerative disc disease), lumbar 12/30/2019   Fatty liver 12/30/2019   Hip pain, bilateral 12/30/2019   Multiple gastric ulcers 12/30/2019   PONV (postoperative nausea and vomiting) 12/30/2019   Memory loss    Depression    Anxiety    Mild CAD 11/15/2018   Costochondritis 11/15/2018   Elevated blood pressure reading without diagnosis of hypertension 08/29/2018   Hyperlipidemia 08/28/2018   Chest pain in adult 08/28/2018   Epigastric pain  03/14/2018   Gastro-esophageal reflux disease without esophagitis 03/14/2018   History of colonic polyps 03/14/2018   Irritable bowel syndrome with constipation 03/14/2018   Symptomatic menopausal or female climacteric states 05/11/2016   Bilateral sciatica 02/11/2015   Fibromyalgia 02/11/2015   Insomnia 02/11/2015   Low back pain 02/11/2015   Depression with anxiety 02/11/2015   Hernia, rectovaginal 11/10/2014   Cystocele, midline 09/25/2014    Past Surgical History:  Procedure Laterality Date   ABDOMINAL HYSTERECTOMY     BLADDER SURGERY     CHOLECYSTECTOMY     CYSTOCELE REPAIR     RECTOCELE REPAIR     VESICOVAGINAL FISTULA CLOSURE W/ TAH  2012    OB History   No obstetric history on file.      Home Medications    Prior to Admission medications   Medication Sig Start Date End Date Taking? Authorizing Provider  nitrofurantoin , macrocrystal-monohydrate, (MACROBID ) 100 MG capsule Take 1 capsule (100 mg total) by mouth 2 (two) times daily. 11/18/23  Yes Netha Dafoe A, FNP  Alirocumab  (PRALUENT ) 150 MG/ML SOAJ Inject 1 mL (150 mg total) into the skin every 14 (fourteen) days. 10/16/23   Carlin Delon BROCKS, NP  aspirin  EC 81 MG tablet Take 1 tablet (81 mg total) by mouth daily. 01/01/20   Monetta Redell PARAS, MD  buPROPion (WELLBUTRIN XL) 150 MG 24 hr tablet Take 150 mg by mouth daily.  12/10/14   [provider]  estradiol (CLIMARA - DOSED IN MG/24 HR) 0.1 mg/24hr  patch Place 1 patch onto the skin once a week. 08/07/18   [provider]  etodolac  (LODINE ) 400 MG tablet TAKE 1 TABLET TWICE A DAY 11/08/18   Whitfield Raisin, NP  ezetimibe  (ZETIA ) 10 MG tablet Take 1 tablet (10 mg total) by mouth daily. 05/08/23   Carlin Delon BROCKS, NP  gabapentin  (NEURONTIN ) 100 MG capsule Take 100 mg by mouth 3 (three) times daily. 03/15/23   [provider]  magnesium 30 MG tablet Take 30 mg by mouth 2 (two) times daily.    [provider]  nitroGLYCERIN  (NITROSTAT ) 0.4 MG  SL tablet Place 1 tablet (0.4 mg total) under the tongue every 5 (five) minutes as needed for chest pain. 05/08/23 11/10/23  Carlin Delon BROCKS, NP  omega-3 acid ethyl esters (LOVAZA ) 1 g capsule Take 1 capsule (1 g total) by mouth 2 (two) times daily. 08/23/23   Carlin Delon BROCKS, NP  polyethylene glycol [facility] / GLYCOLAX) packet Take 1 packet by mouth daily.    [provider]  vitamin B-12 (CYANOCOBALAMIN) 100 MCG tablet Take 100 mcg by mouth daily.    [provider]    Family History Family History  Problem Relation Age of Onset   Irregular heart beat Mother    Lung cancer Father    Heart attack Maternal Grandmother     Social History Social History   Tobacco Use   Smoking status: Never    Passive exposure: Never   Smokeless tobacco: Never  Vaping Use   Vaping status: Never Used  Substance Use Topics   Alcohol use: Never    Alcohol/week: 0.0 standard drinks of alcohol   Drug use: Never     Allergies   Codeine and Pravastatin   Review of Systems Review of Systems  Constitutional:  Negative for fever.  Gastrointestinal:  Negative for abdominal pain, nausea and vomiting.  Genitourinary:  Positive for dysuria. Negative for flank pain and vaginal discharge.     Physical Exam Triage Vital Signs ED Triage Vitals  Encounter Vitals Group     BP 11/18/23 0955 136/87     Girls Systolic BP Percentile --      Girls Diastolic BP Percentile --      Boys Systolic BP Percentile --      Boys Diastolic BP Percentile --      Pulse Rate 11/18/23 0955 83     Resp 11/18/23 0955 20     Temp 11/18/23 0955 98.7 F (37.1 C)     Temp Source 11/18/23 0955 Oral     SpO2 11/18/23 0955 97 %     Weight --      Height --      Head Circumference --      Peak Flow --      Pain Score 11/18/23 0957 7     Pain Loc --      Pain Education --      Exclude from Growth Chart --    No data found.  Updated Vital Signs BP 136/87 (BP Location: Right Arm)   Pulse 83   Temp  98.7 F (37.1 C) (Oral)   Resp 20   SpO2 97%   Visual Acuity Right Eye Distance:   Left Eye Distance:   Bilateral Distance:    Right Eye Near:   Left Eye Near:    Bilateral Near:     Physical Exam   UC Treatments / Results  Labs (all labs ordered are listed, but only abnormal  results are displayed) Labs Reviewed  POCT URINALYSIS DIP (MANUAL ENTRY) - Abnormal; Notable for the following components:      Result Value   Color, UA orange (*)    Clarity, UA hazy (*)    Glucose, UA =100 (*)    Bilirubin, UA small (*)    Blood, UA large (*)    Protein Ur, POC =100 (*)    Urobilinogen, UA 2.0 (*)    Nitrite, UA Positive (*)    Leukocytes, UA Large (3+) (*)    All other components within normal limits  URINE CULTURE    EKG   Radiology No results found.  Procedures Procedures (including critical care time)  Medications Ordered in UC Medications - No data to display  Initial Impression / Assessment and Plan / UC Course  I have reviewed the triage vital signs and the nursing notes.  Pertinent labs & imaging results that were available during my care of the patient were reviewed by me and considered in my medical decision making (see chart for details).     Acute cystitis-urine with large leuks, positive nitrates, large blood.  Will go ahead and treat for urinary tract infection at this time.  Culture pending.  Recommend continue Azo as needed and drink plenty of fluids.  Follow-up as needed Final Clinical Impressions(s) / UC Diagnoses   Final diagnoses:  Acute cystitis without hematuria     Discharge Instructions      Treating you for a UTI. Take the antibiotics as prescribed.  You could continue the Azo as needed.  Make sure you are drinking plenty of fluids and follow-up as needed    ED Prescriptions     Medication Sig Dispense Auth. Provider   nitrofurantoin , macrocrystal-monohydrate, (MACROBID ) 100 MG capsule Take 1 capsule (100 mg total) by mouth 2  (two) times daily. 10 capsule Adah Wilbert LABOR, FNP      PDMP not reviewed this encounter.   Adah Wilbert LABOR, FNP 11/18/23 1016

## 2023-11-18 NOTE — ED Triage Notes (Signed)
 Extreme pain when attempting to void x 3 days.Has been taking AZO. States many years ago, had problems with UTI's and GYN would stretch her urethra to eliminate frequency of infections.

## 2023-11-20 LAB — URINE CULTURE: Culture: 70000 — AB

## 2023-11-21 ENCOUNTER — Ambulatory Visit (HOSPITAL_COMMUNITY): Payer: Self-pay

## 2023-11-21 MED ORDER — SULFAMETHOXAZOLE-TRIMETHOPRIM 800-160 MG PO TABS: 1.0000 | ORAL_TABLET | Freq: Two times a day (BID) | ORAL | 0 refills | Status: AC

## 2023-12-04 ENCOUNTER — Telehealth: Payer: Self-pay | Admitting: Urology

## 2023-12-04 NOTE — Telephone Encounter (Signed)
 Patient called and left a voicemail requesting an appointment for HX of recurrent uti's and dysuria. I returned her call and scheduled her a NP appt with Dr. Roseann per her request. She last saw him in 2017. Notes are in Epic and care everywhere.  Kimberly Swanson

## 2024-01-01 ENCOUNTER — Ambulatory Visit (INDEPENDENT_AMBULATORY_CARE_PROVIDER_SITE_OTHER): Admitting: Urology

## 2024-01-01 ENCOUNTER — Encounter: Payer: Self-pay | Admitting: Urology

## 2024-01-01 VITALS — BP 142/87 | HR 80 | Ht 65.0 in | Wt 155.0 lb

## 2024-01-01 DIAGNOSIS — Z87440 Personal history of urinary (tract) infections: Secondary | ICD-10-CM | POA: Diagnosis not present

## 2024-01-01 DIAGNOSIS — Z09 Encounter for follow-up examination after completed treatment for conditions other than malignant neoplasm: Secondary | ICD-10-CM | POA: Diagnosis not present

## 2024-01-01 LAB — URINALYSIS, ROUTINE W REFLEX MICROSCOPIC
Bilirubin, UA: POSITIVE — AB
Glucose, UA: NEGATIVE
Leukocytes,UA: NEGATIVE
Nitrite, UA: NEGATIVE
Protein,UA: NEGATIVE
RBC, UA: NEGATIVE
Specific Gravity, UA: 1.025 (ref 1.005–1.030)
Urobilinogen, Ur: 0.2 mg/dL (ref 0.2–1.0)
pH, UA: 6 (ref 5.0–7.5)

## 2024-01-01 LAB — MICROSCOPIC EXAMINATION

## 2024-01-01 LAB — BLADDER SCAN AMB NON-IMAGING

## 2024-01-01 NOTE — Progress Notes (Signed)
 Assessment: 1. History of UTI     Plan: I personally reviewed the patient's chart including provider notes, lab results. I discussed methods to reduce the risk of UTIs including increase fluid intake, timed and double voiding, daily cranberry supplement, and daily probiotic. Return to office as needed.  Chief Complaint:  Chief Complaint  Patient presents with   History of UTI    History of Present Illness:  Kimberly Swanson is a 63 y.o. female who is seen in consultation from [place], [place] A, NP for evaluation of UTIs. She was previously followed in [facility] and was last seen in July 2017. She is status post a transobturator mid urethral sling and Elevate bladder sling in November 2016 for management of a cystocele.  She had some difficulty voiding postoperatively requiring a Foley catheter.  She ultimately passed a voiding trial.  She was doing well at the time of her visit in July 2017. She was recently diagnosed with a UTI.  Urine culture from 11/19/2024 grew 70K Serratia.  She was initially treated with Macrobid  and switched to Bactrim  once the sensitivities were available.  She is not having any UTI symptoms today.  No problems with recurrent UTIs.  She has overall been doing very well from a urinary standpoint.  She feels like she empties her bladder completely.  Past Medical History:  Past Medical History:  Diagnosis Date   Anxiety    Bilateral sciatica 02/11/2015   Costochondritis 11/15/2018   DDD (degenerative disc disease), lumbar 12/30/2019   Depression    Epigastric pain 03/14/2018   Fatty liver 12/30/2019   Fibromyalgia    Gastro-esophageal reflux disease without esophagitis 03/14/2018   History of colonic polyps 03/14/2018   Irritable bowel syndrome with constipation 03/14/2018   Memory loss    Multiple gastric ulcers 12/30/2019   Formatting of this note might be different from the original. age 37   PONV (postoperative nausea and vomiting) 12/30/2019   Symptomatic  menopausal or female climacteric states 05/11/2016    Past Surgical History:  Past Surgical History:  Procedure Laterality Date   ABDOMINAL HYSTERECTOMY     BLADDER SURGERY     CHOLECYSTECTOMY     CYSTOCELE REPAIR     RECTOCELE REPAIR     VESICOVAGINAL FISTULA CLOSURE W/ TAH  2012    Allergies:  Allergies  Allergen Reactions   Codeine Nausea And Vomiting   Pravastatin     myalgia    Family History:  Family History  Problem Relation Age of Onset   Irregular heart beat Mother    Lung cancer Father    Heart attack Maternal Grandmother     Social History:  Social History   Tobacco Use   Smoking status: Never    Passive exposure: Never   Smokeless tobacco: Never  Vaping Use   Vaping status: Never Used  Substance Use Topics   Alcohol use: Never    Alcohol/week: 0.0 standard drinks of alcohol   Drug use: Never    Review of symptoms:  Constitutional:  Negative for unexplained weight loss, night sweats, fever, chills ENT:  Negative for nose bleeds, sinus pain, painful swallowing CV:  Negative for chest pain, shortness of breath, exercise intolerance, palpitations, loss of consciousness Resp:  Negative for cough, wheezing, shortness of breath GI:  Negative for nausea, vomiting, diarrhea, bloody stools GU:  Positives noted in HPI; otherwise negative for gross hematuria, dysuria, urinary incontinence Neuro:  Negative for seizures, poor balance, limb weakness, slurred speech  Psych:  Negative for lack of energy, depression, anxiety Endocrine:  Negative for polydipsia, polyuria, symptoms of hypoglycemia (dizziness, hunger, sweating) Hematologic:  Negative for anemia, purpura, petechia, prolonged or excessive bleeding, use of anticoagulants  Allergic:  Negative for difficulty breathing or choking as a result of exposure to anything; no shellfish allergy; no allergic response (rash/itch) to materials, foods  Physical exam: BP (!) 142/87   Pulse 80   Ht 5' 5 (1.651 m)    Wt 155 lb (70.3 kg)   BMI 25.79 kg/m  GENERAL APPEARANCE:  Well appearing, well developed, well nourished, NAD HEENT: Atraumatic, Normocephalic, oropharynx clear. NECK: Supple without lymphadenopathy or thyromegaly. LUNGS: Clear to auscultation bilaterally. HEART: Regular Rate and Rhythm without murmurs, gallops, or rubs. ABDOMEN: Soft, non-tender, No Masses. EXTREMITIES: Moves all extremities well.  Without clubbing, cyanosis, or edema. NEUROLOGIC:  Alert and oriented x 3, normal gait, CN II-XII grossly intact.  MENTAL STATUS:  Appropriate. BACK:  Non-tender to palpation.  No CVAT SKIN:  Warm, dry and intact.    Results: U/A: 0-5 WBCs, 0-2 RBCs  PVR = 1 ml

## 2024-01-08 ENCOUNTER — Telehealth: Payer: Self-pay | Admitting: Cardiology

## 2024-01-08 MED ORDER — OMEGA-3-ACID ETHYL ESTERS 1 G PO CAPS: 1.0000 | ORAL_CAPSULE | Freq: Two times a day (BID) | ORAL | 2 refills | Status: AC

## 2024-01-08 MED ORDER — EZETIMIBE 10 MG PO TABS
10.0000 mg | ORAL_TABLET | Freq: Every day | ORAL | 2 refills | Status: AC
Start: 2024-01-08 — End: ?

## 2024-01-08 NOTE — Telephone Encounter (Signed)
 *  STAT* If patient is at the pharmacy, call can be transferred to refill team.   1. Which medications need to be refilled? (please list name of each medication and dose if known)   ezetimibe  (ZETIA ) 10 MG tablet  omega-3 acid ethyl esters (LOVAZA ) 1 g capsule   2. Which pharmacy/location (including street and city if local pharmacy) is medication to be sent to?  [facility] [place] [facility] - [place], [place] - [address]   3. Do they need a 30 day or 90 day supply? 90

## 2024-01-08 NOTE — Telephone Encounter (Signed)
 RX sent in

## 2024-04-22 ENCOUNTER — Telehealth: Payer: Self-pay | Admitting: Cardiology

## 2024-04-22 NOTE — Telephone Encounter (Signed)
 Called the patient and she stated that she has been having chest pain/pressure on her left upper chest that radiates down her left arm for the past 6 months. I recommended that she go to the ER to be evaluated and she stated that she had done that in December of last year. The ER ended up admitting her and then her insurance refused to pay for the hospitalization per the patient. I encouraged her to go to the ER today to be evaluated and she refused to go to the ER. She stated that I am not going to the ER today. She requested an office appointment and an appointment was scheduled for her to see Dr. Liborio on 04/26/24. Patient verbalized understanding and had no further questions at this time.

## 2024-04-22 NOTE — Telephone Encounter (Signed)
  Patient said she is still having issues with her left side. She still can't put any weight on her left side when walking or lying down. She stated that it is painful for her and that it is getting worse

## 2024-04-25 ENCOUNTER — Other Ambulatory Visit: Payer: Self-pay

## 2024-04-26 ENCOUNTER — Ambulatory Visit

## 2024-04-26 VITALS — BP 124/74 | HR 80 | Ht 65.0 in | Wt 158.6 lb

## 2024-04-26 DIAGNOSIS — I2510 Atherosclerotic heart disease of native coronary artery without angina pectoris: Secondary | ICD-10-CM | POA: Diagnosis not present

## 2024-04-26 DIAGNOSIS — R079 Chest pain, unspecified: Secondary | ICD-10-CM

## 2024-04-26 DIAGNOSIS — R072 Precordial pain: Secondary | ICD-10-CM | POA: Diagnosis not present

## 2024-04-26 DIAGNOSIS — E782 Mixed hyperlipidemia: Secondary | ICD-10-CM

## 2024-04-26 MED ORDER — METOPROLOL TARTRATE 100 MG PO TABS: 100.0000 mg | ORAL_TABLET | Freq: Once | ORAL | 0 refills | Status: AC

## 2024-04-26 NOTE — Patient Instructions (Addendum)
 Medication Instructions:  Your physician recommends that you continue on your current medications as directed. Please refer to the Current Medication list given to you today.  *If you need a refill on your cardiac medications before your next appointment, please call your pharmacy*  Lab Work: Your physician recommends that you return for lab work in:   Labs today: BMP  If you have labs (blood work) drawn today and your tests are completely normal, you will receive your results only by: MyChart Message (if you have MyChart) OR A paper copy in the mail If you have any lab test that is abnormal or we need to change your treatment, we will call you to review the results.  Testing/Procedures:  Your physician has requested that you have an echocardiogram. Echocardiography is a painless test that uses sound waves to create images of your heart. It provides your doctor with information about the size and shape of your heart and how well your heart's chambers and valves are working. This procedure takes approximately one hour. There are no restrictions for this procedure. Please do NOT wear cologne, perfume, aftershave, or lotions (deodorant is allowed). Please arrive 15 minutes prior to your appointment time.  Please note: We ask at that you not bring children with you during ultrasound (echo/ vascular) testing. Due to room size and safety concerns, children are not allowed in the ultrasound rooms during exams. Our front office staff cannot provide observation of children in our lobby area while testing is being conducted. An adult accompanying a patient to their appointment will only be allowed in the ultrasound room at the discretion of the ultrasound technician under special circumstances. We apologize for any inconvenience.     Your cardiac CT will be scheduled at one of the below locations:   [facility] [address] [place], [place] [address] [phone] (Severe  contrast allergies only)  OR   [facility] [address] [place], [place] [address] [phone]  OR   [facility] [address] [place], [place] [address] [phone]  OR   [facility] and Vascular Tower [address]  [place], [place] [address]  OR   [facility] [address] [place], [place] [phone]  If scheduled at [facility], please arrive at the [facility] and Children's Entrance (Entrance C2) of [facility] 30 minutes prior to test start time. You can use the FREE valet parking offered at entrance C (encouraged to control the heart rate for the test)  Proceed to the [facility] Radiology Department (first floor) to check-in and test prep.  All radiology patients and guests should use entrance C2 at [facility] accessed from [facility], even though the hospital's physical address listed is [address]  If scheduled at the Heart and Vascular Tower at [address], please enter the parking lot using the [address] entrance and use the FREE valet service at the patient drop-off area. Enter the building and check-in with registration on the main floor.  If scheduled at [facility] please arrive to the Heart and Vascular Center 15 mins early for check-in and test prep.  There is spacious parking and easy access to the radiology department from the [facility] Heart and Vascular entrance. Please enter here and check-in with the desk attendant.   If scheduled at [facility], please arrive 30 minutes early for check-in and test prep.  Please  follow these instructions carefully (unless otherwise directed):  An IV will be required for this test and Nitroglycerin will be given.  Hold all erectile dysfunction medications at least 3 days (72 hrs) prior to test. (Ie viagra, cialis, sildenafil, tadalafil, etc)   On the Night Before the  Test: Be sure to Drink plenty of water. Do not consume any caffeinated/decaffeinated beverages or chocolate 12 hours prior to your test. Do not take any antihistamines 12 hours prior to your test.  On the Day of the Test: Drink plenty of water until 1 hour prior to the test. Do not eat any food 1 hour prior to test. You may take your regular medications prior to the test.  Take metoprolol (Lopressor) two hours prior to test. Patients who wear a continuous glucose monitor MUST remove the device prior to scanning. FEMALES- please wear underwire-free bra if available, avoid dresses & tight clothing      After the Test: Drink plenty of water. After receiving IV contrast, you may experience a mild flushed feeling. This is normal. On occasion, you may experience a mild rash up to 24 hours after the test. This is not dangerous. If this occurs, you can take Benadryl 25 mg, Zyrtec, Claritin, or Allegra and increase your fluid intake. (Patients taking Tikosyn should avoid Benadryl, and may take Zyrtec, Claritin, or Allegra) If you experience trouble breathing, this can be serious. If it is severe call 911 IMMEDIATELY. If it is mild, please call our office.  We will call to schedule your test 2-4 weeks out understanding that some insurance companies will need an authorization prior to the service being performed.   For more information and frequently asked questions, please visit our website : [email or web address]  For non-scheduling related questions, please contact the cardiac imaging nurse navigator should you have any questions/concerns: Cardiac Imaging Nurse Navigators Direct Office Dial: [phone]   For scheduling needs, including cancellations and rescheduling, please call [place], [phone].   Follow-Up: At [facility], you and your health needs are our priority.  As part of our continuing mission to provide you with exceptional heart care, our providers are  all part of one team.  This team includes your primary Cardiologist (physician) and Advanced Practice Providers or APPs (Physician Assistants and Nurse Practitioners) who all work together to provide you with the care you need, when you need it.  Your next appointment:   2 month(s)  Provider:   Alean Kobus, MD    We recommend signing up for the patient portal called MyChart.  Sign up information is provided on this After Visit Summary.  MyChart is used to connect with patients for Virtual Visits (Telemedicine).  Patients are able to view lab/test results, encounter notes, upcoming appointments, etc.  Non-urgent messages can be sent to your provider as well.   To learn more about what you can do with MyChart, go to [email or web address].   Other Instructions None

## 2024-04-26 NOTE — Assessment & Plan Note (Signed)
 Last lipid panel reviewed from 04/22/2023 with total cholesterol 171, triglycerides 125, HDL 64 and LDL 84.  Continue Zetia  10 mg once daily Lovaza  1 g 2 times daily Alirocumab  Praluent  150 mg subcutaneous injection once every 14 days.

## 2024-04-26 NOTE — Assessment & Plan Note (Signed)
 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  She has remained on aspirin  81 mg once daily and on lipid-lowering therapy as reviewed above.  With her ongoing symptoms of chest pain which is pressure and heaviness like which is occurring intermittently and seem to be occurring at higher intensity and frequency over the last several months, discussed further evaluation for any progression of her underlying coronary atherosclerosis and obstructive coronary artery disease with cardiac CT.  She is agreeable. Will tentatively schedule at the earliest. If she notices any further worsening of her symptoms or if I have symptoms that are not relieving with rest should head to the ER or call 911.  Will also obtain transthoracic echocardiogram to rule out any significant structural and functional abnormalities.

## 2024-04-26 NOTE — Progress Notes (Signed)
 Cardiology Consultation:    Date:  04/26/2024   ID:  Kimberly Swanson, DOB 23-Apr-1961, MRN [id number]  PCP:  Kimberly Greig LABOR, NP  Cardiologist:  Kimberly JONELLE Kobus, MD   Referring MD: Kimberly Greig LABOR, NP   No chief complaint on file.    ASSESSMENT AND PLAN:   [name] 63 year old woman history of nonobstructive CAD [with cardiac CT 11/06/2018 calcium score 59, nonobstructive disease in proximal LAD CT FFR negative], dyslipidemia, statin intolerance, costochondritis, fibromyalgia, GERD   Here for follow-up visit and with concerns of ongoing symptoms of chest pain which are increasing in intensity and frequency.   Problem List Items Addressed This Visit       Cardiovascular and Mediastinum   Mild CAD   History of nonobstructive CAD based on cardiac CT from 11/06/2018 with calcium score 59, proximal LAD atherosclerosis with negative assessment by CT FFR.  She has remained on aspirin  81 mg once daily and on lipid-lowering therapy as reviewed above.  With her ongoing symptoms of chest pain which is pressure and heaviness like which is occurring intermittently and seem to be occurring at higher intensity and frequency over the last several months, discussed further evaluation for any progression of her underlying coronary atherosclerosis and obstructive coronary artery disease with cardiac CT.  She is agreeable. Will tentatively schedule at the earliest. If she notices any further worsening of her symptoms or if I have symptoms that are not relieving with rest should head to the ER or call 911.  Will also obtain transthoracic echocardiogram to rule out any significant structural and functional abnormalities.        Other   Hyperlipidemia   Last lipid panel reviewed from 04/22/2023 with total cholesterol 171, triglycerides 125, HDL 64 and LDL 84.  Continue Zetia  10 mg once daily Lovaza  1 g 2 times daily Alirocumab  Praluent  150 mg subcutaneous injection once every 14 days.      Chest  pain of uncertain etiology - Primary   Relevant Orders   EKG 12-Lead (Completed)   Return to clinic tentatively in 2 months.   History of Present Illness:    Kimberly Swanson is Swanson 63 y.o. female who is being seen today for follow-up visit. PCP is Moon, Amy A, NP. Last visit with me in the office was 11/10/2023.  Pleasant woman here for the visit today by herself.  Lives at home with her husband.  Keeps herself busy at home with day-to-day activities.  Has history of nonobstructive CAD [with cardiac CT 11/06/2018 calcium score 59, nonobstructive disease in proximal LAD CT FFR negative], dyslipidemia, statin intolerance, costochondritis, fibromyalgia, GERD  Here for follow-up visit.  Mentions overall the last several months since her follow-up visit she has been dealing with symptoms of chest discomfort which she feels has been more intense and frequent. She does appear to have 2 distinct types of symptoms 1 which is consistent with her symptoms of costochondritis where when she lays on her left side of the chest she does have intense pain, relieved with sitting up or laying on the right side. She also describes symptoms of chest pressure which is heaviness and can occur randomly and will last for several minutes. Not associated with any shortness of breath, orthopnea, paroxysmal nocturnal dyspnea. No pedal edema. No lightheadedness, dizziness or syncopal episodes.  Good compliance with her medications. Blood pressures are well-controlled not on any blood pressure lowering medications.  EKG in the clinic today shows sinus rhythm heart rate  80/min, PR interval 150 ms, QRS duration 88 ms, QTc 494 ms, no ischemic changes.  Past Medical History:  Diagnosis Date   Anxiety    Bilateral sciatica 02/11/2015   Chest pain in adult 08/28/2018   She presented to [facility] emergency department 07/16/2018 with substernal chest pressure prolonged and was admitted to the hospital initial and  subsequent troponins were undetectable EKG is described as sinus rhythm normal and no ischemic changes her chest pain resolved spontaneously she underwent Swanson myocardial perfusion study which showed normal perfusion normal left ventricular function ej   Costochondritis 11/15/2018   Cystocele, midline 09/25/2014   DDD (degenerative disc disease), lumbar 12/30/2019   Depression    Depression with anxiety 02/11/2015   Elevated blood pressure reading without diagnosis of hypertension 08/29/2018   Epigastric pain 03/14/2018   Fatty liver 12/30/2019   Fibromyalgia    Frequent headaches 11/10/2023   Gastro-esophageal reflux disease without esophagitis 03/14/2018   Hernia, rectovaginal 11/10/2014   History of colonic polyps 03/14/2018   Hyperlipidemia 08/28/2018   Impairment of balance 11/10/2023   Insomnia 02/11/2015   Irritable bowel syndrome with constipation 03/14/2018   Low back pain 02/11/2015   Memory loss    Mild CAD 11/15/2018   Multiple gastric ulcers 12/30/2019   Formatting of this note might be different from the original. age 89   PONV (postoperative nausea and vomiting) 12/30/2019   Symptomatic menopausal or female climacteric states 05/11/2016    Past Surgical History:  Procedure Laterality Date   ABDOMINAL HYSTERECTOMY     BLADDER SURGERY     CHOLECYSTECTOMY     CYSTOCELE REPAIR     RECTOCELE REPAIR     VESICOVAGINAL FISTULA CLOSURE W/ TAH  2012    Current Medications: Active Medications[1]   Allergies:   Codeine and Pravastatin   Social History   Socioeconomic History   Marital status: Married    Spouse name: Not on file   Number of children: Not on file   Years of education: Not on file   Highest education level: Not on file  Occupational History   Not on file  Tobacco Use   Smoking status: Never    Passive exposure: Never   Smokeless tobacco: Never  Vaping Use   Vaping status: Never Used  Substance and Sexual Activity   Alcohol use: Never     Alcohol/week: 0.0 standard drinks of alcohol   Drug use: Never   Sexual activity: Not on file  Other Topics Concern   Not on file  Social History Narrative   Not on file   Social Drivers of Health   Tobacco Use: Low Risk (04/26/2024)   Patient History    Smoking Tobacco Use: Never    Smokeless Tobacco Use: Never    Passive Exposure: Never  Financial Resource Strain: Not on file  Food Insecurity: Low Risk (04/22/2023)   Received from [facility]   Epic    Within the past 12 months, you worried that your food would run out before you got money to buy more: Never true    Within the past 12 months, the food you bought just didn't last and you didn't have money to get more. : Never true  Transportation Needs: No Transportation Needs (04/22/2023)   Received from [facility]    In the past 12 months, has lack of reliable transportation kept you from medical appointments, meetings, work or from getting things needed for daily living? : No  Physical Activity: Not on file  Stress: Not on file  Social Connections: Not on file  Depression (EYV7-0): Not on file  Alcohol Screen: Not on file  Housing: Low Risk (04/22/2023)   Received from [facility]   Epic    What is your living situation today?: I have [place] steady place to live    Think about the place you live. Do you have problems with any of the following? Choose all that apply:: None/None on this list  Utilities: Low Risk (04/22/2023)   Received from [facility]   Utilities    In the past 12 months has the electric, gas, oil, or water company threatened to shut off services in your home? : No  Health Literacy: Not on file     Family History: The patient's family history includes Heart attack in her maternal grandmother; Irregular heart beat in her mother; Lung cancer in her father. ROS:   Please see the history of present illness.    All 14 point review of systems negative except as described per history of  present illness.  EKGs/Labs/Other Studies Reviewed:    The following studies were reviewed today:   EKG:  EKG Interpretation Date/Time:  Friday April 26 2024 10:26:46 EST Ventricular Rate:  80 PR Interval:  150 QRS Duration:  88 QT Interval:  368 QTC Calculation: 424 R Axis:   0  Text Interpretation: Normal sinus rhythm Inferior infarct , age undetermined Cannot rule out Anterior infarct (cited on or before 10-Nov-2023) When compared with ECG of 10-Nov-2023 10:36, No significant change was found Confirmed by Liborio Hai reddy [phone]) on 04/26/2024 10:33:32 AM    Recent Labs: No results found for requested labs within last 365 days.  Recent Lipid Panel    Component Value Date/Time   CHOL 124 03/30/2020 1046   TRIG 96 03/30/2020 1046   HDL 65 03/30/2020 1046   CHOLHDL 1.9 03/30/2020 1046   LDLCALC 41 03/30/2020 1046    Physical Exam:    VS:  BP 124/74   Pulse 80   Ht 5' 5 (1.651 m)   Wt 158 lb 9.6 oz (71.9 kg)   SpO2 96%   BMI 26.39 kg/m     Wt Readings from Last 3 Encounters:  04/26/24 158 lb 9.6 oz (71.9 kg)  01/01/24 155 lb (70.3 kg)  11/10/23 159 lb (72.1 kg)     GENERAL:  Well nourished, well developed in no acute distress NECK: No JVD; No carotid bruits CARDIAC: RRR, S1 and S2 present, no murmurs, no rubs, no gallops.  Nontender chest wall. CHEST:  Clear to auscultation without rales, wheezing or rhonchi  Extremities: No pitting pedal edema. Pulses bilaterally symmetric with radial 2+ and dorsalis pedis 2+ NEUROLOGIC:  Alert and oriented x 3  Medication Adjustments/Labs and Tests Ordered: Current medicines are reviewed at length with the patient today.  Concerns regarding medicines are outlined above.  Orders Placed This Encounter  Procedures   EKG 12-Lead   No orders of the defined types were placed in this encounter.   Signed, Kristine Tiley reddy Avaleigh Decuir, MD, MPH, [facility]. 04/26/2024 10:53 AM    [facility]    [1]   Current Meds  Medication Sig   Alirocumab  (PRALUENT ) 150 MG/ML SOAJ Inject 1 mL (150 mg total) into the skin every 14 (fourteen) days.   aspirin  EC 81 MG tablet Take 1 tablet (81 mg total) by mouth daily.   buPROPion (WELLBUTRIN XL) 150 MG 24 hr tablet  Take 150 mg by mouth daily.    estradiol (CLIMARA - DOSED IN MG/24 HR) 0.1 mg/24hr patch Place 1 patch onto the skin once Swanson week.   etodolac  (LODINE ) 400 MG tablet TAKE 1 TABLET TWICE Swanson DAY   ezetimibe  (ZETIA ) 10 MG tablet Take 1 tablet (10 mg total) by mouth daily.   gabapentin  (NEURONTIN ) 100 MG capsule Take 100 mg by mouth 3 (three) times daily.   magnesium 30 MG tablet Take 30 mg by mouth 2 (two) times daily.   nitroGLYCERIN  (NITROSTAT ) 0.4 MG SL tablet Place 1 tablet (0.4 mg total) under the tongue every 5 (five) minutes as needed for chest pain.   omega-3 acid ethyl esters (LOVAZA ) 1 g capsule Take 1 capsule (1 g total) by mouth 2 (two) times daily.   polyethylene glycol (MIRALAX / GLYCOLAX) packet Take 1 packet by mouth daily.   vitamin B-12 (CYANOCOBALAMIN) 100 MCG tablet Take 100 mcg by mouth daily.   [DISCONTINUED] cyclobenzaprine  (FLEXERIL ) 5 MG tablet Take 5-10 mg by mouth at bedtime as needed.

## 2024-05-10 ENCOUNTER — Encounter (HOSPITAL_COMMUNITY): Payer: Self-pay

## 2024-05-14 ENCOUNTER — Inpatient Hospital Stay (HOSPITAL_BASED_OUTPATIENT_CLINIC_OR_DEPARTMENT_OTHER): Admission: RE | Admit: 2024-05-14 | Source: Ambulatory Visit | Admitting: Radiology

## 2024-05-28 ENCOUNTER — Ambulatory Visit

## 2024-05-31 ENCOUNTER — Telehealth: Payer: Self-pay

## 2024-05-31 ENCOUNTER — Other Ambulatory Visit: Payer: Self-pay

## 2024-05-31 MED ORDER — PRALUENT 150 MG/ML ~~LOC~~ SOAJ: 150.0000 mg | SUBCUTANEOUS | 2 refills | Status: AC

## 2024-05-31 NOTE — Telephone Encounter (Signed)
 [facility] is requesting Prior Authorization for Praluent  150 mg/mL. Thank you.

## 2024-06-04 ENCOUNTER — Telehealth: Payer: Self-pay | Admitting: Pharmacy Technician

## 2024-06-04 ENCOUNTER — Other Ambulatory Visit: Payer: Self-pay

## 2024-06-04 DIAGNOSIS — R079 Chest pain, unspecified: Secondary | ICD-10-CM

## 2024-06-04 DIAGNOSIS — I2510 Atherosclerotic heart disease of native coronary artery without angina pectoris: Secondary | ICD-10-CM

## 2024-06-04 DIAGNOSIS — E782 Mixed hyperlipidemia: Secondary | ICD-10-CM

## 2024-06-04 DIAGNOSIS — R072 Precordial pain: Secondary | ICD-10-CM

## 2024-06-04 NOTE — Telephone Encounter (Signed)
 Pt calling again to f/u on this. She has not been able to pick up her Rx yet. Please advise.

## 2024-06-04 NOTE — Telephone Encounter (Signed)
 Pt spoke with pharmacy and was told Rx went from $46 to $135 and she cannot afford now. Please advise

## 2024-06-04 NOTE — Telephone Encounter (Signed)
 PA for Praluent  submitted (Key: [id number])

## 2024-06-04 NOTE — Telephone Encounter (Signed)
 Patient Advocate Encounter   The patient was approved for a [employer] grant that will help cover the cost of PRALUENT  Total amount awarded, 2500.  Effective: 05/05/24 - 05/04/25   APW:[id number] ERW:[id number] Hmnle:[id number] PI:[id number] [employer] ID: [id number]   Pharmacy provided with approval and processing information. Patient informed via telephone

## 2024-06-04 NOTE — Telephone Encounter (Signed)
 Got the patient a grant. Gave to pharmacy and called and lmom for patient

## 2024-06-04 NOTE — Telephone Encounter (Signed)
PA approved. Pt made aware.

## 2024-06-05 ENCOUNTER — Telehealth (HOSPITAL_COMMUNITY): Payer: Self-pay | Admitting: *Deleted

## 2024-06-05 NOTE — Telephone Encounter (Signed)

## 2024-06-06 ENCOUNTER — Ambulatory Visit (HOSPITAL_BASED_OUTPATIENT_CLINIC_OR_DEPARTMENT_OTHER)
Admission: RE | Admit: 2024-06-06 | Discharge: 2024-06-06 | Disposition: A | Discharge status: Home / Self Care | Source: Ambulatory Visit

## 2024-06-06 DIAGNOSIS — R072 Precordial pain: Secondary | ICD-10-CM

## 2024-06-06 MED ORDER — IOHEXOL 350 MG/ML SOLN
95.0000 mL | Freq: Once | INTRAVENOUS | Status: AC | PRN
  Administered 2024-06-06: 95 mL via INTRAVENOUS

## 2024-06-06 MED ORDER — METOPROLOL TARTRATE 5 MG/5ML IV SOLN: 10.0000 mg | Freq: Once | INTRAVENOUS | Status: DC | PRN

## 2024-06-06 MED ORDER — NITROGLYCERIN 0.4 MG SL SUBL
0.8000 mg | SUBLINGUAL_TABLET | Freq: Once | SUBLINGUAL | Status: AC
  Administered 2024-06-06: 0.8 mg via SUBLINGUAL

## 2024-06-06 MED ORDER — DILTIAZEM HCL 25 MG/5ML IV SOLN: 10.0000 mg | INTRAVENOUS | Status: DC | PRN

## 2024-06-06 NOTE — Telephone Encounter (Signed)
 Confirmed patient got VM about grant

## 2024-06-11 ENCOUNTER — Ambulatory Visit (HOSPITAL_BASED_OUTPATIENT_CLINIC_OR_DEPARTMENT_OTHER)

## 2024-06-20 ENCOUNTER — Ambulatory Visit: Payer: Self-pay

## 2024-06-20 DIAGNOSIS — I2510 Atherosclerotic heart disease of native coronary artery without angina pectoris: Secondary | ICD-10-CM

## 2024-07-05 ENCOUNTER — Ambulatory Visit
